# Patient Record
Sex: Male | Born: 1980
Health system: Southern US, Community
[De-identification: ages and names within clinical notes are randomized; demographics above are authoritative.]

## PROBLEM LIST (undated history)

## (undated) DIAGNOSIS — K648 Other hemorrhoids: Secondary | ICD-10-CM

## (undated) DIAGNOSIS — K760 Fatty (change of) liver, not elsewhere classified: Secondary | ICD-10-CM

## (undated) DIAGNOSIS — K529 Noninfective gastroenteritis and colitis, unspecified: Secondary | ICD-10-CM

## (undated) DIAGNOSIS — K219 Gastro-esophageal reflux disease without esophagitis: Secondary | ICD-10-CM

## (undated) HISTORY — PX: NO PAST SURGERIES: SHX2092

---

## 2014-09-01 ENCOUNTER — Ambulatory Visit (INDEPENDENT_AMBULATORY_CARE_PROVIDER_SITE_OTHER): Payer: BLUE CROSS/BLUE SHIELD | Admitting: Family Medicine

## 2014-09-01 ENCOUNTER — Encounter: Payer: Self-pay | Admitting: Family Medicine

## 2014-09-01 VITALS — BP 122/72 | HR 60 | Temp 97.8°F | Resp 16 | Ht 68.0 in | Wt 225.6 lb

## 2014-09-01 DIAGNOSIS — R197 Diarrhea, unspecified: Secondary | ICD-10-CM | POA: Diagnosis not present

## 2014-09-01 DIAGNOSIS — K625 Hemorrhage of anus and rectum: Secondary | ICD-10-CM | POA: Diagnosis not present

## 2014-09-01 MED ORDER — HYDROCORTISONE ACE-PRAMOXINE 2.5-1 % RE CREA: 1.0000 "application " | TOPICAL_CREAM | Freq: Three times a day (TID) | RECTAL | Status: DC

## 2014-09-01 NOTE — Patient Instructions (Signed)
We will call you with the lab results and decide on next step.

## 2014-09-01 NOTE — Progress Notes (Signed)
Subjective:     Patient ID: Kevin Roach, male   DOB: [birth year], 34 y.o.   MRN: [id number]  HPI  Chief Complaint  Patient presents with  . Abdominal Pain    Patient comes in office today with concerns of lower abdominal pain over the past 4 weeks. Patient states that pain has been radiating to his right side and describes it as a shooting pain. Patient states that he is having 5 bowel movements a day that is consistent of diarrhea and has noticed blood in the stool.   States his normal bowel pattern is 3-4 x day of formed stool. Now his stools are loose to pasty and up to 5 x day.Has seen bright red blood on his toilet paper. Reports a course of Zithromax prior to the onset of his sx. No weight loss or family hx of diverticulitis or IBD. Denies unusual stress in his life.   Review of Systems  Gastrointestinal: Positive for nausea (transient nausea a day or two ago. ). Negative for vomiting.       Objective:   Physical Exam  Constitutional: He appears well-developed and well-nourished. No distress.  Abdominal: Soft. There is tenderness (epigastric). There is no guarding.  Genitourinary: Guaiac positive stool (tender rectum without evidence of hemorrhoid).       Assessment:    1. Diarrhea - Stool C-Diff Toxin Assay  2. Rectal bleeding - hydrocortisone-pramoxine (ANALPRAM HC) 2.5-1 % rectal cream; Place 1 application rectally 3 (three) times daily.  Dispense: 30 g; Refill: 0    Plan:    Further evaluation pending results of study. Consider G.I.referral

## 2014-09-04 ENCOUNTER — Telehealth: Payer: Self-pay

## 2014-09-04 ENCOUNTER — Other Ambulatory Visit: Payer: Self-pay | Admitting: Family Medicine

## 2014-09-04 DIAGNOSIS — K625 Hemorrhage of anus and rectum: Secondary | ICD-10-CM

## 2014-09-04 DIAGNOSIS — R109 Unspecified abdominal pain: Secondary | ICD-10-CM

## 2014-09-04 LAB — CLOSTRIDIUM DIFFICILE EIA: C difficile Toxins A+B, EIA: NEGATIVE

## 2014-09-04 NOTE — Telephone Encounter (Signed)
Also try Prevacid 15 mg (over the counter) two pills daily. If not continuing to improve over the next few days-call for referral to G.I. Specialist.

## 2014-09-04 NOTE — Telephone Encounter (Signed)
Referral in progress. 

## 2014-09-04 NOTE — Telephone Encounter (Signed)
Advised patient of lab results. Patient reports that he still feels about the same. He reports that he will try Imodium. He also feels really bloated as well. Will the imodium help with this also? Please advise. Thanks!

## 2014-09-04 NOTE — Telephone Encounter (Signed)
Patient reports that he will try prevacid as well. Patient wants to go ahead and proceed with GI referral. He says that he just doesn't feel his symptoms are normal. Please call patient when referral is scheduled. Thanks!

## 2014-09-04 NOTE — Telephone Encounter (Signed)
-----   Message from Anola Gurney, [place] sent at 09/04/2014 10:13 AM EDT ----- Stool test is negative. Are you feeling better? May try imodium for loose stools if still present.

## 2014-09-04 NOTE — Telephone Encounter (Signed)
Advised pt as directed below, pt verbalized fully understanding.   Thanks,   

## 2014-09-05 ENCOUNTER — Telehealth: Payer: Self-pay

## 2014-09-05 ENCOUNTER — Other Ambulatory Visit: Payer: Self-pay | Admitting: Family Medicine

## 2014-09-05 DIAGNOSIS — R197 Diarrhea, unspecified: Secondary | ICD-10-CM

## 2014-09-05 MED ORDER — CIPROFLOXACIN HCL 500 MG PO TABS: 500.0000 mg | ORAL_TABLET | Freq: Two times a day (BID) | ORAL | Status: DC

## 2014-09-05 NOTE — Telephone Encounter (Signed)
-----   Message from Tobie Chauvin, PA sent at 09/04/2014 10:13 AM EDT ----- Stool test is negative. Are you feeling better? May try imodium for loose stools if still present. 

## 2014-09-05 NOTE — Telephone Encounter (Signed)
Have left a message regarding trial on Cipro for 3 days to cover for Traveler's diarrhea. G.I.consult is pending.

## 2014-09-05 NOTE — Telephone Encounter (Signed)
Patient has been advised he states that he still feels "cruddy" and bloated. He states there has been no improvement. KW

## 2014-09-18 ENCOUNTER — Other Ambulatory Visit: Payer: Self-pay | Admitting: Gastroenterology

## 2014-09-18 DIAGNOSIS — R748 Abnormal levels of other serum enzymes: Secondary | ICD-10-CM

## 2014-09-24 ENCOUNTER — Ambulatory Visit
Admission: RE | Admit: 2014-09-24 | Discharge: 2014-09-24 | Disposition: A | Discharge status: Home / Self Care | Payer: BLUE CROSS/BLUE SHIELD | Source: Ambulatory Visit | Attending: Gastroenterology | Admitting: Gastroenterology

## 2014-09-24 DIAGNOSIS — K760 Fatty (change of) liver, not elsewhere classified: Secondary | ICD-10-CM | POA: Insufficient documentation

## 2014-09-24 DIAGNOSIS — R748 Abnormal levels of other serum enzymes: Secondary | ICD-10-CM | POA: Diagnosis present

## 2014-09-24 DIAGNOSIS — K8020 Calculus of gallbladder without cholecystitis without obstruction: Secondary | ICD-10-CM | POA: Diagnosis not present

## 2014-10-02 ENCOUNTER — Encounter: Payer: Self-pay | Admitting: *Deleted

## 2014-10-03 ENCOUNTER — Ambulatory Visit
Admission: RE | Admit: 2014-10-03 | Discharge: 2014-10-03 | Disposition: A | Discharge status: Home / Self Care | Payer: BLUE CROSS/BLUE SHIELD | Source: Ambulatory Visit | Attending: Gastroenterology | Admitting: Gastroenterology

## 2014-10-03 ENCOUNTER — Encounter
Admission: RE | Disposition: A | Discharge status: Home / Self Care | Payer: Self-pay | Source: Ambulatory Visit | Attending: Gastroenterology

## 2014-10-03 ENCOUNTER — Ambulatory Visit: Payer: BLUE CROSS/BLUE SHIELD | Admitting: Anesthesiology

## 2014-10-03 ENCOUNTER — Encounter: Payer: Self-pay | Admitting: Anesthesiology

## 2014-10-03 DIAGNOSIS — K529 Noninfective gastroenteritis and colitis, unspecified: Secondary | ICD-10-CM | POA: Insufficient documentation

## 2014-10-03 DIAGNOSIS — K219 Gastro-esophageal reflux disease without esophagitis: Secondary | ICD-10-CM | POA: Diagnosis not present

## 2014-10-03 DIAGNOSIS — Z87891 Personal history of nicotine dependence: Secondary | ICD-10-CM | POA: Insufficient documentation

## 2014-10-03 DIAGNOSIS — Z79899 Other long term (current) drug therapy: Secondary | ICD-10-CM | POA: Insufficient documentation

## 2014-10-03 DIAGNOSIS — K640 First degree hemorrhoids: Secondary | ICD-10-CM | POA: Diagnosis not present

## 2014-10-03 HISTORY — DX: Gastro-esophageal reflux disease without esophagitis: K21.9

## 2014-10-03 HISTORY — PX: COLONOSCOPY WITH PROPOFOL: SHX5780

## 2014-10-03 HISTORY — PX: COLONOSCOPY: SHX174

## 2014-10-03 SURGERY — COLONOSCOPY WITH PROPOFOL
Anesthesia: General

## 2014-10-03 MED ORDER — LIDOCAINE HCL (CARDIAC) 20 MG/ML IV SOLN
INTRAVENOUS | Status: DC | PRN
  Administered 2014-10-03: 20 mg via INTRAVENOUS

## 2014-10-03 MED ORDER — SODIUM CHLORIDE 0.9 % IV SOLN: INTRAVENOUS | Status: DC

## 2014-10-03 MED ORDER — PROPOFOL 10 MG/ML IV BOLUS
INTRAVENOUS | Status: DC | PRN
  Administered 2014-10-03: 120 mg via INTRAVENOUS
  Administered 2014-10-03: 20 mg via INTRAVENOUS

## 2014-10-03 MED ORDER — GLYCOPYRROLATE 0.2 MG/ML IJ SOLN
INTRAMUSCULAR | Status: DC | PRN
  Administered 2014-10-03: 0.2 mg via INTRAVENOUS

## 2014-10-03 MED ORDER — PROPOFOL INFUSION 10 MG/ML OPTIME
INTRAVENOUS | Status: DC | PRN
  Administered 2014-10-03: 200 ug/kg/min via INTRAVENOUS

## 2014-10-03 MED ORDER — SODIUM CHLORIDE 0.9 % IV SOLN
INTRAVENOUS | Status: DC
  Administered 2014-10-03: 1000 mL via INTRAVENOUS
  Administered 2014-10-03: 14:00:00 via INTRAVENOUS

## 2014-10-03 NOTE — Anesthesia Postprocedure Evaluation (Signed)
  Anesthesia Post-op Note  Patient: Kevin Roach  Procedure(s) Performed: Procedure(s): COLONOSCOPY WITH PROPOFOL (N/A)  Anesthesia type:General  Patient location: PACU  Post pain: Pain level controlled  Post assessment: Post-op Vital signs reviewed, Patient's Cardiovascular Status Stable, Respiratory Function Stable, Patent Airway and No signs of Nausea or vomiting  Post vital signs: Reviewed and stable  Last Vitals:  Filed Vitals:   10/03/14 1440  BP: 97/66  Pulse: 44  Temp:   Resp: 20    Level of consciousness: awake, alert  and patient cooperative  Complications: No apparent anesthesia complications

## 2014-10-03 NOTE — Op Note (Signed)
[facility] Gastroenterology Patient Name: Kevin Roach Procedure Date: 10/03/2014 1:42 PM MRN: [id number] Account #: [id number] Date of Birth: 27-Oct-1980 Admit Type: Outpatient Age: 34 Room: [facility] ENDO ROOM 3 Gender: Male Note Status: Finalized Procedure:         Colonoscopy Indications:       Chronic diarrhea, abdominal bloating Patient Profile:   This is a 34 year old male. Providers:         Rhona Raider. Shelle Iron, MD Referring MD:      Demetrios Isaacs. Sherrie Mustache, MD (Referring MD) Medicines:         Propofol per Anesthesia Complications:     No immediate complications. Procedure:         Pre-Anesthesia Assessment:                    - Prior to the procedure, a History and Physical was                     performed, and patient medications, allergies and                     sensitivities were reviewed. The patient's tolerance of                     previous anesthesia was reviewed.                    After obtaining informed consent, the colonoscope was                     passed under direct vision. Throughout the procedure, the                     patient's blood pressure, pulse, and oxygen saturations                     were monitored continuously. The Colonoscope was                     introduced through the anus and advanced to the the                     terminal ileum. The colonoscopy was performed without                     difficulty. The patient tolerated the procedure well. The                     quality of the bowel preparation was good. Findings:      The perianal and digital rectal examinations were normal.      The terminal ileum appeared normal.      Internal hemorrhoids were found during retroflexion. The hemorrhoids       were Grade I (internal hemorrhoids that do not prolapse).      The exam was otherwise without abnormality.      Biopsies for histology were taken with a cold forceps from the right       colon, left colon and rectum for evaluation of  microscopic colitis. Impression:        - The examined portion of the ileum was normal.                    - Internal hemorrhoids.                    -  The examination was otherwise normal.                    - No specimens collected.                    - Likely IBS-D Recommendation:    - Observe patient in GI recovery unit.                    - Continue present medications.                    - Await pathology results.                    - Consider course of xifaxin for IBS-D.                    - Low fodmap diet, probiotic.                    - Return to GI clinic.                    - The findings and recommendations were discussed with the                     patient.                    - The findings and recommendations were discussed with the                     patient's family. Procedure Code(s): --- Professional ---                    [phone], Colonoscopy, flexible; with biopsy, single or                     multiple CPT copyright 2014 American Medical Association. All rights reserved. The codes documented in this report are preliminary and upon coder review may  be revised to meet current compliance requirements. Kathalene Frames, MD 10/03/2014 5:62:13 PM This report has been signed electronically. Number of Addenda: 0 Note Initiated On: 10/03/2014 1:42 PM Scope Withdrawal Time: 0 hours 13 minutes 22 seconds  Total Procedure Duration: 0 hours 17 minutes 34 seconds       [facility]

## 2014-10-03 NOTE — Anesthesia Procedure Notes (Signed)
Date/Time: 10/03/2014 1:40 PM Performed by: Lenard Simmer Pre-anesthesia Checklist: Patient identified, Emergency Drugs available, Suction available, Patient being monitored and Timeout performed Patient Re-evaluated:Patient Re-evaluated prior to inductionOxygen Delivery Method: Nasal cannula Preoxygenation: Pre-oxygenation with 100% oxygen Intubation Type: IV induction

## 2014-10-03 NOTE — Anesthesia Preprocedure Evaluation (Signed)
Anesthesia Evaluation  Patient identified by MRN, date of birth, ID band Patient awake    Reviewed: Allergy & Precautions, H&P , NPO status , Patient's Chart, lab work & pertinent test results, reviewed documented beta blocker date and time   History of Anesthesia Complications Negative for: history of anesthetic complications  Airway Mallampati: I  TM Distance: >3 FB Neck ROM: full    Dental no notable dental hx. (+) Teeth Intact   Pulmonary neg shortness of breath, neg sleep apnea, neg COPD, neg recent URI, former smoker,    Pulmonary exam normal breath sounds clear to auscultation       Cardiovascular Exercise Tolerance: Good negative cardio ROS Normal cardiovascular exam Rhythm:regular Rate:Normal     Neuro/Psych negative neurological ROS  negative psych ROS   GI/Hepatic Neg liver ROS, GERD  ,  Endo/Other  negative endocrine ROS  Renal/GU negative Renal ROS  negative genitourinary   Musculoskeletal   Abdominal   Peds  Hematology negative hematology ROS (+)   Anesthesia Other Findings Past Medical History:   GERD (gastroesophageal reflux disease)                       Reproductive/Obstetrics negative OB ROS                             Anesthesia Physical Anesthesia Plan  ASA: I  Anesthesia Plan: General   Post-op Pain Management:    Induction:   Airway Management Planned:   Additional Equipment:   Intra-op Plan:   Post-operative Plan:   Informed Consent: I have reviewed the patients History and Physical, chart, labs and discussed the procedure including the risks, benefits and alternatives for the proposed anesthesia with the patient or authorized representative who has indicated his/her understanding and acceptance.   Dental Advisory Given  Plan Discussed with: Anesthesiologist, CRNA and Surgeon  Anesthesia Plan Comments:         Anesthesia Quick  Evaluation

## 2014-10-03 NOTE — H&P (Signed)
  Primary Care Physician:  Anola Gurney, PA  Pre-Procedure History & Physical: HPI:  MILFRED KRAMMES is a 34 y.o. male is here for an colonoscopy.   Past Medical History  Diagnosis Date  . GERD (gastroesophageal reflux disease)     Past Surgical History  Procedure Laterality Date  . No past surgeries      Prior to Admission medications   Medication Sig Start Date End Date Taking? Authorizing Provider  ciprofloxacin (CIPRO) 500 MG tablet Take 1 tablet (500 mg total) by mouth 2 (two) times daily. Patient not taking: Reported on 10/03/2014 09/05/14   Anola Gurney, PA  hydrocortisone-pramoxine [facility]) 2.5-1 % rectal cream Place 1 application rectally 3 (three) times daily. Patient not taking: Reported on 10/03/2014 09/01/14   Anola Gurney, PA  omeprazole (PRILOSEC) 40 MG capsule Take 40 mg by mouth daily.    Historical Provider, MD    Allergies as of 09/15/2014  . (No Known Allergies)    Family History  Problem Relation Age of Onset  . Healthy Mother   . Alcohol abuse Father   . Healthy Brother   . Healthy Brother     Social History   Social History  . Marital Status: Divorced    Spouse Name: N/A  . Number of Children: N/A  . Years of Education: N/A   Occupational History  . Not on file.   Social History Main Topics  . Smoking status: Former Smoker    Types: Cigarettes    Quit date: 06/25/2014  . Smokeless tobacco: Former Neurosurgeon    Types: Snuff    Quit date: 06/25/2014  . Alcohol Use: 0.0 oz/week    0 Standard drinks or equivalent per week     Comment: occasional    . Drug Use: No  . Sexual Activity: Not on file   Other Topics Concern  . Not on file   Social History Narrative     Physical Exam: BP 117/77 mmHg  Pulse 78  Temp(Src) 97.3 F (36.3 C) (Tympanic)  Resp 16  Ht  (1.727 m)  Wt 103.42 kg (228 lb)  BMI 34.68 kg/m2  SpO2 98% General:   Alert,  pleasant and cooperative in NAD Head:  Normocephalic and atraumatic. Neck:  Supple; no  masses or thyromegaly. Lungs:  Clear throughout to auscultation.    Heart:  Regular rate and rhythm. Abdomen:  Soft, nontender and nondistended. Normal bowel sounds, without guarding, and without rebound.   Neurologic:  Alert and  oriented x4;  grossly normal neurologically.  Impression/Plan: TROY KANOUSE is here for an colonoscopy to be performed for chronic diarrhea  Risks, benefits, limitations, and alternatives regarding  colonoscopy have been reviewed with the patient.  Questions have been answered.  All parties agreeable.   Elnita Maxwell, MD  10/03/2014, 1:30 PM

## 2014-10-03 NOTE — Transfer of Care (Signed)
Immediate Anesthesia Transfer of Care Note  Patient: Kevin Roach  Procedure(s) Performed: Procedure(s): COLONOSCOPY WITH PROPOFOL (N/A)  Patient Location: Endoscopy Unit  Anesthesia Type:General  Level of Consciousness: awake  Airway & Oxygen Therapy: Patient Spontanous Breathing and Patient connected to nasal cannula oxygen  Post-op Assessment: Report given to RN and Post -op Vital signs reviewed and stable  Post vital signs: Reviewed and stable  Last Vitals:  Filed Vitals:   10/03/14 1227  BP: 117/77  Pulse: 78  Temp: 36.3 C  Resp: 16    Complications: No apparent anesthesia complications

## 2014-10-07 LAB — SURGICAL PATHOLOGY

## 2014-10-20 DIAGNOSIS — K529 Noninfective gastroenteritis and colitis, unspecified: Secondary | ICD-10-CM | POA: Insufficient documentation

## 2014-10-20 DIAGNOSIS — K589 Irritable bowel syndrome without diarrhea: Secondary | ICD-10-CM | POA: Insufficient documentation

## 2014-10-27 ENCOUNTER — Emergency Department (HOSPITAL_COMMUNITY)
Admission: EM | Admit: 2014-10-27 | Discharge: 2014-10-27 | Disposition: A | Discharge status: Home / Self Care | Payer: BLUE CROSS/BLUE SHIELD | Attending: Emergency Medicine | Admitting: Emergency Medicine

## 2014-10-27 ENCOUNTER — Encounter (HOSPITAL_COMMUNITY): Payer: Self-pay | Admitting: Family Medicine

## 2014-10-27 ENCOUNTER — Emergency Department (HOSPITAL_COMMUNITY): Payer: BLUE CROSS/BLUE SHIELD

## 2014-10-27 DIAGNOSIS — R1013 Epigastric pain: Secondary | ICD-10-CM | POA: Diagnosis present

## 2014-10-27 DIAGNOSIS — R197 Diarrhea, unspecified: Secondary | ICD-10-CM | POA: Insufficient documentation

## 2014-10-27 DIAGNOSIS — Z79899 Other long term (current) drug therapy: Secondary | ICD-10-CM | POA: Diagnosis not present

## 2014-10-27 DIAGNOSIS — Z720 Tobacco use: Secondary | ICD-10-CM | POA: Diagnosis not present

## 2014-10-27 DIAGNOSIS — K219 Gastro-esophageal reflux disease without esophagitis: Secondary | ICD-10-CM

## 2014-10-27 LAB — BASIC METABOLIC PANEL
Anion gap: 6 (ref 5–15)
BUN: 14 mg/dL (ref 6–20)
CHLORIDE: 102 mmol/L (ref 101–111)
CO2: 30 mmol/L (ref 22–32)
CREATININE: 1.19 mg/dL (ref 0.61–1.24)
Calcium: 9.8 mg/dL (ref 8.9–10.3)
GFR calc non Af Amer: >60 mL/min (ref >60)
Glucose, Bld: 95 mg/dL (ref 65–99)
POTASSIUM: 4.1 mmol/L (ref 3.5–5.1)
SODIUM: 138 mmol/L (ref 135–145)

## 2014-10-27 LAB — CBC
HEMATOCRIT: 45.1 % (ref 39.0–52.0)
HEMOGLOBIN: 15.1 g/dL (ref 13.0–17.0)
MCH: 30.3 pg (ref 26.0–34.0)
MCHC: 33.5 g/dL (ref 30.0–36.0)
MCV: 90.6 fL (ref 78.0–100.0)
Platelets: 202 10*3/uL (ref 150–400)
RBC: 4.98 MIL/uL (ref 4.22–5.81)
RDW: 13.1 % (ref 11.5–15.5)
WBC: 9.8 10*3/uL (ref 4.0–10.5)

## 2014-10-27 LAB — I-STAT CHEM 8, ED
BUN: 14 mg/dL (ref 6–20)
CALCIUM ION: 1.27 mmol/L — AB (ref 1.12–1.23)
CHLORIDE: 101 mmol/L (ref 101–111)
CREATININE: 1.1 mg/dL (ref 0.61–1.24)
GLUCOSE: 89 mg/dL (ref 65–99)
HCT: 47 % (ref 39.0–52.0)
Hemoglobin: 16 g/dL (ref 13.0–17.0)
POTASSIUM: 3.7 mmol/L (ref 3.5–5.1)
Sodium: 140 mmol/L (ref 135–145)
TCO2: 25 mmol/L (ref 0–100)

## 2014-10-27 LAB — I-STAT TROPONIN, ED
TROPONIN I, POC: 0 ng/mL (ref 0.00–0.08)
Troponin i, poc: 0 ng/mL (ref 0.00–0.08)

## 2014-10-27 MED ORDER — GI COCKTAIL ~~LOC~~
30.0000 mL | Freq: Once | ORAL | Status: AC
  Administered 2014-10-27: 30 mL via ORAL
  Filled 2014-10-27: qty 30

## 2014-10-27 MED ORDER — DICYCLOMINE HCL 10 MG/ML IM SOLN
20.0000 mg | Freq: Once | INTRAMUSCULAR | Status: DC
  Filled 2014-10-27: qty 2

## 2014-10-27 MED ORDER — OMEPRAZOLE 20 MG PO CPDR: 20.0000 mg | DELAYED_RELEASE_CAPSULE | Freq: Every day | ORAL | Status: DC

## 2014-10-27 NOTE — ED Notes (Signed)
Istat troponin running

## 2014-10-27 NOTE — ED Provider Notes (Signed)
CSN: [id number]   Arrival date & time 10/27/14 0209  History  By signing my name below, I, Bethel Born, attest that this documentation has been prepared under the direction and in the presence of Perkins Molina, MD. Electronically Signed: Bethel Born, ED Scribe. 10/27/2014. 2:49 AM.  Chief Complaint  Patient presents with  . Chest Pain    HPI Patient is a 34 y.o. male presenting with chest pain. The history is provided by the patient. No language interpreter was used.  Chest Pain Pain location:  Epigastric Pain quality: burning   Pain radiates to:  Upper back Pain radiates to the back: yes   Pain severity:  Moderate Onset quality:  Gradual Duration:  4 hours Timing:  Constant Progression:  Partially resolved Chronicity:  New Context: eating   Relieved by:  Antacids Worsened by:  Nothing tried Ineffective treatments:  None tried Associated symptoms: no cough, no fever, no nausea, no orthopnea, no palpitations, no PND, no shortness of breath, not vomiting and no weakness   Risk factors: no aortic disease, no Marfan's syndrome, no prior DVT/PE and no surgery    Kevin Roach is a 34 y.o. male with PMHx of GERD who presents to the Emergency Department complaining of new substernal chest pain with onset around 11 PM tonight. The pain starts at the epigastrium and radiates to the lower chest. Pt describes the pain as burning. He ate a beer and a cheeseburger for dinner. The pain improved after Tums just before midnight. Associated symptoms include belching. He has been having intermittent diarrhea for weeks and is seeing a GI specialist.  Pt denies a bitter taste in the back of the throat, constipation, nausea, and vomiting.   Past Medical History  Diagnosis Date  . GERD (gastroesophageal reflux disease)     Past Surgical History  Procedure Laterality Date  . No past surgeries    . Colonoscopy with propofol N/A 10/03/2014    Procedure: COLONOSCOPY WITH PROPOFOL;  Surgeon:  Elnita Maxwell, MD;  Location: [facility] ENDOSCOPY;  Service: Endoscopy;  Laterality: N/A;    Family History  Problem Relation Age of Onset  . Healthy Mother   . Alcohol abuse Father   . Healthy Brother   . Healthy Brother     Social History  Substance Use Topics  . Smoking status: Current Some Day Smoker    Types: Cigarettes  . Smokeless tobacco: Former Neurosurgeon    Types: Snuff    Quit date: 06/25/2014  . Alcohol Use: 0.0 oz/week    0 Standard drinks or equivalent per week     Comment: Usually drinks once a week. Last drink: Tonight     Review of Systems  Constitutional: Negative for fever and chills.  Respiratory: Negative for cough and shortness of breath.   Cardiovascular: Positive for chest pain. Negative for palpitations, orthopnea, leg swelling and PND.  Gastrointestinal: Positive for diarrhea. Negative for nausea, vomiting and constipation.       Belching  Neurological: Negative for weakness.  All other systems reviewed and are negative.  Home Medications   Prior to Admission medications   Medication Sig Start Date End Date Taking? Authorizing Provider  ciprofloxacin (CIPRO) 500 MG tablet Take 1 tablet (500 mg total) by mouth 2 (two) times daily. Patient not taking: Reported on 10/03/2014 09/05/14   Anola Gurney, PA  hydrocortisone-pramoxine [facility]) 2.5-1 % rectal cream Place 1 application rectally 3 (three) times daily. Patient not taking: Reported on 10/03/2014 09/01/14   Molly Maduro  Chauvin, PA  omeprazole (PRILOSEC) 40 MG capsule Take 40 mg by mouth daily.    Historical Provider, MD    Allergies  Review of patient's allergies indicates no known allergies.  Triage Vitals: BP 124/76 mmHg  Pulse 62  Temp(Src) 97.8 F (36.6 C) (Oral)  Resp 18  Ht  (1.753 m)  Wt 220 lb (99.791 kg)  BMI 32.47 kg/m2  SpO2 98%  Physical Exam  Constitutional: He is oriented to person, place, and time. He appears well-developed and well-nourished.  HENT:  Head: Normocephalic.   Mouth/Throat: Posterior oropharyngeal erythema present.  Eyes: Pupils are equal, round, and reactive to light.  Neck: Normal range of motion. Neck supple.  Trachea midline  Cardiovascular: Normal rate, regular rhythm and intact distal pulses.   Pulmonary/Chest: Effort normal and breath sounds normal. No stridor. No respiratory distress. He has no wheezes. He has no rales.  Abdominal: Soft. He exhibits no mass. There is no tenderness. There is no rebound and no guarding.  Exceptionally hyperactive bowel sounds  Musculoskeletal: Normal range of motion. He exhibits no edema or tenderness.  Lymphadenopathy:    He has no cervical adenopathy.  Neurological: He is alert and oriented to person, place, and time. He has normal reflexes.  Skin: Skin is warm and dry.  Psychiatric: He has a normal mood and affect. His behavior is normal.  Nursing note and vitals reviewed.   ED Course  Procedures   DIAGNOSTIC STUDIES: Oxygen Saturation is 98% on RA, normal by my interpretation.    COORDINATION OF CARE: 2:46 AM Discussed treatment plan which includes GI cocktail, Bentyl, CXR, EKG, and lab work with pt at bedside and pt agreed to plan.  Labs Reviewed  BASIC METABOLIC PANEL  CBC  I-STAT CHEM 8, ED    Imaging Review No results found.   I personally reviewed and evaluated these images and lab results as a part of my medical decision-making.   EKG Interpretation  Date/Time:  Monday October 27 2014 02:14:41 EDT Ventricular Rate:  60 PR Interval:  154 QRS Duration: 90 QT Interval:  402 QTC Calculation: 402 R Axis:   66 Text Interpretation:  Sinus rhythm Confirmed by [facility]  MD, Mishael Haran ([id number]) on 10/27/2014 2:38:29 AM    MDM   Final diagnoses:  None  HEART Score 0 PERC negative wells 0, highly doubt PE. Rules out for MI with 2 negative troponins and normal EKG.  Symptoms resolved with GI cocktail and are consistent with gerd.  Will start prilosec and gerd friendly diet and  have follow up with GI.  Strict return precautions given   I, Kataleyah Carducci-RASCH,Myranda Pavone K, personally performed the services described in this documentation. All medical record entries made by the scribe were at my direction and in my presence.  I have reviewed the chart and discharge instructions and agree that the record reflects my personal performance and is accurate and complete. Arda Daggs-RASCH,Harmonee Tozer K.  10/27/2014. 4:01 AM.   }   Cagney Degrace, MD 10/27/14 0401

## 2014-10-27 NOTE — ED Notes (Signed)
Pt given discharge instructions, verbalized understanding of discharge papers including need to follow up, reasons to return to the ED and medications to take at home. Pt denies pain or further questions at discharge. Pt able to ambulate to exit with significant other without difficulty.

## 2014-10-27 NOTE — ED Notes (Signed)
Patient reports at 23:00 last night he developed low stereum chest pain that radiates to his back. Pt denies the pain currently. Reports feeling lightheadness.

## 2014-11-13 ENCOUNTER — Other Ambulatory Visit: Payer: Self-pay | Admitting: Gastroenterology

## 2014-11-13 DIAGNOSIS — R1030 Lower abdominal pain, unspecified: Secondary | ICD-10-CM

## 2014-11-18 ENCOUNTER — Ambulatory Visit
Admission: RE | Admit: 2014-11-18 | Discharge: 2014-11-18 | Disposition: A | Discharge status: Home / Self Care | Payer: BLUE CROSS/BLUE SHIELD | Source: Ambulatory Visit | Attending: Gastroenterology | Admitting: Gastroenterology

## 2014-11-18 DIAGNOSIS — R1030 Lower abdominal pain, unspecified: Secondary | ICD-10-CM

## 2014-11-18 DIAGNOSIS — R161 Splenomegaly, not elsewhere classified: Secondary | ICD-10-CM | POA: Insufficient documentation

## 2014-11-18 DIAGNOSIS — K5730 Diverticulosis of large intestine without perforation or abscess without bleeding: Secondary | ICD-10-CM | POA: Diagnosis not present

## 2014-11-18 DIAGNOSIS — K760 Fatty (change of) liver, not elsewhere classified: Secondary | ICD-10-CM | POA: Insufficient documentation

## 2014-11-18 MED ORDER — IOHEXOL 300 MG/ML  SOLN: 100.0000 mL | Freq: Once | INTRAMUSCULAR | Status: DC | PRN

## 2014-11-18 MED ORDER — IOHEXOL 350 MG/ML SOLN
100.0000 mL | Freq: Once | INTRAVENOUS | Status: AC | PRN
  Administered 2014-11-18: 100 mL via INTRAVENOUS

## 2014-11-28 ENCOUNTER — Ambulatory Visit: Payer: BLUE CROSS/BLUE SHIELD | Admitting: Family Medicine

## 2014-11-28 DIAGNOSIS — K760 Fatty (change of) liver, not elsewhere classified: Secondary | ICD-10-CM | POA: Insufficient documentation

## 2014-11-30 ENCOUNTER — Emergency Department: Payer: BLUE CROSS/BLUE SHIELD

## 2014-11-30 ENCOUNTER — Observation Stay
Admission: EM | Admit: 2014-11-30 | Discharge: 2014-12-01 | Disposition: A | Discharge status: Home / Self Care | Payer: BLUE CROSS/BLUE SHIELD | Attending: Internal Medicine | Admitting: Internal Medicine

## 2014-11-30 ENCOUNTER — Encounter: Payer: Self-pay | Admitting: Emergency Medicine

## 2014-11-30 DIAGNOSIS — Z811 Family history of alcohol abuse and dependence: Secondary | ICD-10-CM | POA: Diagnosis not present

## 2014-11-30 DIAGNOSIS — Z9109 Other allergy status, other than to drugs and biological substances: Secondary | ICD-10-CM | POA: Diagnosis not present

## 2014-11-30 DIAGNOSIS — F17210 Nicotine dependence, cigarettes, uncomplicated: Secondary | ICD-10-CM | POA: Insufficient documentation

## 2014-11-30 DIAGNOSIS — R52 Pain, unspecified: Secondary | ICD-10-CM

## 2014-11-30 DIAGNOSIS — M79604 Pain in right leg: Secondary | ICD-10-CM | POA: Diagnosis not present

## 2014-11-30 DIAGNOSIS — K529 Noninfective gastroenteritis and colitis, unspecified: Secondary | ICD-10-CM | POA: Diagnosis not present

## 2014-11-30 DIAGNOSIS — M5127 Other intervertebral disc displacement, lumbosacral region: Secondary | ICD-10-CM | POA: Insufficient documentation

## 2014-11-30 DIAGNOSIS — M5440 Lumbago with sciatica, unspecified side: Secondary | ICD-10-CM

## 2014-11-30 DIAGNOSIS — K760 Fatty (change of) liver, not elsewhere classified: Secondary | ICD-10-CM | POA: Insufficient documentation

## 2014-11-30 DIAGNOSIS — K219 Gastro-esophageal reflux disease without esophagitis: Secondary | ICD-10-CM | POA: Diagnosis not present

## 2014-11-30 DIAGNOSIS — M5430 Sciatica, unspecified side: Secondary | ICD-10-CM | POA: Diagnosis not present

## 2014-11-30 DIAGNOSIS — G8929 Other chronic pain: Secondary | ICD-10-CM | POA: Diagnosis not present

## 2014-11-30 DIAGNOSIS — M545 Low back pain: Principal | ICD-10-CM | POA: Insufficient documentation

## 2014-11-30 DIAGNOSIS — M5459 Other low back pain: Secondary | ICD-10-CM | POA: Diagnosis present

## 2014-11-30 DIAGNOSIS — Z79899 Other long term (current) drug therapy: Secondary | ICD-10-CM | POA: Diagnosis not present

## 2014-11-30 LAB — CBC WITH DIFFERENTIAL/PLATELET
Basophils Absolute: 0 10*3/uL (ref 0–0.1)
Basophils Relative: 0 %
EOS ABS: 0.3 10*3/uL (ref 0–0.7)
Eosinophils Relative: 3 %
HCT: 51.6 % (ref 40.0–52.0)
HEMOGLOBIN: 17.2 g/dL (ref 13.0–18.0)
LYMPHS ABS: 2.3 10*3/uL (ref 1.0–3.6)
LYMPHS PCT: 27 %
MCH: 29.7 pg (ref 26.0–34.0)
MCHC: 33.4 g/dL (ref 32.0–36.0)
MCV: 89.1 fL (ref 80.0–100.0)
Monocytes Absolute: 0.5 10*3/uL (ref 0.2–1.0)
Monocytes Relative: 7 %
NEUTROS PCT: 63 %
Neutro Abs: 5.3 10*3/uL (ref 1.4–6.5)
Platelets: 185 10*3/uL (ref 150–440)
RBC: 5.79 MIL/uL (ref 4.40–5.90)
RDW: 13.1 % (ref 11.5–14.5)
WBC: 8.5 10*3/uL (ref 3.8–10.6)

## 2014-11-30 LAB — URINALYSIS COMPLETE WITH MICROSCOPIC (ARMC ONLY)
Bacteria, UA: NONE SEEN
Bilirubin Urine: NEGATIVE
Glucose, UA: NEGATIVE mg/dL
Hgb urine dipstick: NEGATIVE
Leukocytes, UA: NEGATIVE
NITRITE: NEGATIVE
PH: 5 (ref 5.0–8.0)
PROTEIN: NEGATIVE mg/dL
SPECIFIC GRAVITY, URINE: 1.028 (ref 1.005–1.030)
Squamous Epithelial / LPF: NONE SEEN

## 2014-11-30 LAB — COMPREHENSIVE METABOLIC PANEL
ALT: 86 U/L — ABNORMAL HIGH (ref 17–63)
ANION GAP: 4 — AB (ref 5–15)
AST: 51 U/L — ABNORMAL HIGH (ref 15–41)
Albumin: 4.1 g/dL (ref 3.5–5.0)
Alkaline Phosphatase: 45 U/L (ref 38–126)
BUN: 16 mg/dL (ref 6–20)
CHLORIDE: 107 mmol/L (ref 101–111)
CO2: 28 mmol/L (ref 22–32)
Calcium: 9.1 mg/dL (ref 8.9–10.3)
Creatinine, Ser: 1.2 mg/dL (ref 0.61–1.24)
Glucose, Bld: 79 mg/dL (ref 65–99)
POTASSIUM: 3.9 mmol/L (ref 3.5–5.1)
Sodium: 139 mmol/L (ref 135–145)
Total Bilirubin: 1.5 mg/dL — ABNORMAL HIGH (ref 0.3–1.2)
Total Protein: 6.9 g/dL (ref 6.5–8.1)

## 2014-11-30 MED ORDER — HYDROMORPHONE HCL 1 MG/ML IJ SOLN
INTRAMUSCULAR | Status: AC
  Filled 2014-11-30: qty 1

## 2014-11-30 MED ORDER — DOCUSATE SODIUM 100 MG PO CAPS
100.0000 mg | ORAL_CAPSULE | Freq: Two times a day (BID) | ORAL | Status: DC
  Administered 2014-11-30 – 2014-12-01 (×2): 100 mg via ORAL
  Filled 2014-11-30 (×2): qty 1

## 2014-11-30 MED ORDER — HEPARIN SODIUM (PORCINE) 5000 UNIT/ML IJ SOLN
5000.0000 [IU] | Freq: Three times a day (TID) | INTRAMUSCULAR | Status: DC
  Administered 2014-11-30 – 2014-12-01 (×2): 5000 [IU] via SUBCUTANEOUS
  Filled 2014-11-30 (×2): qty 1

## 2014-11-30 MED ORDER — POLYETHYLENE GLYCOL 3350 17 G PO PACK: 17.0000 g | PACK | Freq: Every day | ORAL | Status: DC | PRN

## 2014-11-30 MED ORDER — ONDANSETRON HCL 4 MG/2ML IJ SOLN: 4.0000 mg | Freq: Four times a day (QID) | INTRAMUSCULAR | Status: DC | PRN

## 2014-11-30 MED ORDER — ACETAMINOPHEN 650 MG RE SUPP: 650.0000 mg | Freq: Four times a day (QID) | RECTAL | Status: DC | PRN

## 2014-11-30 MED ORDER — OXYCODONE HCL 5 MG PO TABS: 5.0000 mg | ORAL_TABLET | ORAL | Status: DC | PRN

## 2014-11-30 MED ORDER — ONDANSETRON HCL 4 MG PO TABS: 4.0000 mg | ORAL_TABLET | Freq: Four times a day (QID) | ORAL | Status: DC | PRN

## 2014-11-30 MED ORDER — HYDROMORPHONE HCL 1 MG/ML IJ SOLN
1.0000 mg | INTRAMUSCULAR | Status: DC | PRN
  Administered 2014-12-01: 1 mg via INTRAVENOUS
  Filled 2014-11-30: qty 1

## 2014-11-30 MED ORDER — KETOROLAC TROMETHAMINE 30 MG/ML IJ SOLN
30.0000 mg | Freq: Once | INTRAMUSCULAR | Status: AC
  Administered 2014-11-30: 30 mg via INTRAVENOUS
  Filled 2014-11-30: qty 1

## 2014-11-30 MED ORDER — LORAZEPAM 2 MG/ML IJ SOLN
INTRAMUSCULAR | Status: AC
  Administered 2014-11-30: 1 mg via INTRAMUSCULAR
  Filled 2014-11-30: qty 1

## 2014-11-30 MED ORDER — LORAZEPAM 2 MG/ML IJ SOLN
1.0000 mg | Freq: Once | INTRAMUSCULAR | Status: AC
  Administered 2014-11-30: 1 mg via INTRAMUSCULAR

## 2014-11-30 MED ORDER — DEXAMETHASONE SODIUM PHOSPHATE 10 MG/ML IJ SOLN
INTRAMUSCULAR | Status: AC
  Filled 2014-11-30: qty 1

## 2014-11-30 MED ORDER — ACETAMINOPHEN 325 MG PO TABS: 650.0000 mg | ORAL_TABLET | Freq: Four times a day (QID) | ORAL | Status: DC | PRN

## 2014-11-30 MED ORDER — DEXAMETHASONE SODIUM PHOSPHATE 4 MG/ML IJ SOLN
4.0000 mg | Freq: Once | INTRAMUSCULAR | Status: AC
  Administered 2014-11-30: 4 mg via INTRAVENOUS

## 2014-11-30 MED ORDER — HYDROMORPHONE HCL 1 MG/ML IJ SOLN
1.0000 mg | Freq: Once | INTRAMUSCULAR | Status: AC
  Administered 2014-11-30: 1 mg via INTRAVENOUS

## 2014-11-30 NOTE — H&P (Signed)
[facility] [place] [facility]   PATIENT NAME: Kevin Roach    MR#:  [id number]  DATE OF BIRTH:  September 30, 1980   DATE OF ADMISSION:  11/30/2014  PRIMARY CARE PHYSICIAN: Anola Gurneyobert Chauvin, PA   REQUESTING/REFERRING PHYSICIAN: Darnelle CatalanMalinda  CHIEF COMPLAINT:   Chief Complaint  Patient presents with  . Back Pain    HISTORY OF PRESENT ILLNESS:  Kevin Roach  is a 34 y.o. male with a known history of GERD without esophagitis presenting with back pain. He describes approximately 4 day duration of back pain lower back with radiation down the right leg described as sharp/shooting intensity 8/10 worse with standing or movement summary relief while sitting still. He denies any trauma preceding symptoms. MRI actually performed in emergency department which reveals small L5-S1 disc protrusion without neural impingement. However, on attempts to ambulate patient became acutely symptomatic to the point where he is unable to stand/ambulate.  PAST MEDICAL HISTORY:   Past Medical History  Diagnosis Date  . GERD (gastroesophageal reflux disease)     PAST SURGICAL HISTORY:   Past Surgical History  Procedure Laterality Date  . No past surgeries    . Colonoscopy with propofol N/A 10/03/2014    Procedure: COLONOSCOPY WITH PROPOFOL;  Surgeon: Elnita MaxwellMatthew Gordon Rein, MD;  [facility];  Service: Endoscopy;  Laterality: N/A;    SOCIAL HISTORY:   Social History  Substance Use Topics  . Smoking status: Current Some Day Smoker    Types: Cigarettes  . Smokeless tobacco: Former NeurosurgeonUser    Types: Snuff    Quit date: 06/25/2014  . Alcohol Use: 0.0 oz/week    0 Standard drinks or equivalent per week     Comment: Usually drinks once a week. Last drink: Tonight    FAMILY HISTORY:   Family History  Problem Relation Age of Onset  . Healthy Mother   . Alcohol abuse Father   . Healthy Brother   . Healthy Brother     DRUG ALLERGIES:   Allergies  Allergen Reactions   . Ragwitek  [Short Ragweed Pollen Ext]     Other reaction(s): Cough    REVIEW OF SYSTEMS:  REVIEW OF SYSTEMS:  CONSTITUTIONAL: Denies fevers, chills, fatigue, weakness.  EYES: Denies blurred vision, double vision, or eye pain.  EARS, NOSE, THROAT: Denies tinnitus, ear pain, hearing loss.  RESPIRATORY: denies cough, shortness of breath, wheezing  CARDIOVASCULAR: Denies chest pain, palpitations, edema.  GASTROINTESTINAL: Denies nausea, vomiting, diarrhea, abdominal pain.  GENITOURINARY: Denies dysuria, hematuria.  ENDOCRINE: Denies nocturia or thyroid problems. HEMATOLOGIC AND LYMPHATIC: Denies easy bruising or bleeding.  SKIN: Denies rash or lesions.  MUSCULOSKELETAL: Denies pain in neck, , shoulder, knees, hips, or further arthritic symptoms. Positive back pain  NEUROLOGIC: Denies paralysis, paresthesias.  PSYCHIATRIC: Denies anxiety or depressive symptoms. Otherwise full review of systems performed by me is negative.   MEDICATIONS AT HOME:   Prior to Admission medications   Medication Sig Start Date End Date Taking? Authorizing Provider  cyclobenzaprine (FLEXERIL) 10 MG tablet Take 10 mg by mouth 3 (three) times daily as needed for muscle spasms.   Yes Historical Provider, MD  ibuprofen (ADVIL,MOTRIN) 200 MG tablet Take 800 mg by mouth every 8 (eight) hours as needed for mild pain or moderate pain.   Yes Historical Provider, MD  omeprazole (PRILOSEC) 20 MG capsule Take 1 capsule (20 mg total) by mouth daily. 10/27/14  Yes April Palumbo, MD      VITAL SIGNS:  Blood pressure  106/78, pulse 58, temperature 98.1 F (36.7 C), temperature source Oral, resp. rate 16, height  (1.753 m), weight 218 lb (98.884 kg), SpO2 95 %.  PHYSICAL EXAMINATION:  VITAL SIGNS: Filed Vitals:   11/30/14 2049  BP: 106/78  Pulse: 58  Temp:   Resp: 16   GENERAL:34 y.o.male currently in no acute distress.  HEAD: Normocephalic, atraumatic.  EYES: Pupils equal, round, reactive to light.  Extraocular muscles intact. No scleral icterus.  MOUTH: Moist mucosal membrane. Dentition intact. No abscess noted.  EAR, NOSE, THROAT: Clear without exudates. No external lesions.  NECK: Supple. No thyromegaly. No nodules. No JVD.  PULMONARY: Clear to ascultation, without wheeze rails or rhonci. No use of accessory muscles, Good respiratory effort. good air entry bilaterally CHEST: Nontender to palpation.  CARDIOVASCULAR: S1 and S2. Regular rate and rhythm. No murmurs, rubs, or gallops. No edema. Pedal pulses 2+ bilaterally.  GASTROINTESTINAL: Soft, nontender, nondistended. No masses. Positive bowel sounds. No hepatosplenomegaly.  MUSCULOSKELETAL: No swelling, clubbing, or edema.Positive straight leg test right leg  NEUROLOGIC: Cranial nerves II through XII are intact. No gross focal neurological deficits. Sensation intact. Reflexes intact.  SKIN: No ulceration, lesions, rashes, or cyanosis. Skin warm and dry. Turgor intact.  PSYCHIATRIC: Mood, affect within normal limits. The patient is awake, alert and oriented x 3. Insight, judgment intact.    LABORATORY PANEL:   CBC  Recent Labs Lab 11/30/14 1546  WBC 8.5  HGB 17.2  HCT 51.6  PLT 185   ------------------------------------------------------------------------------------------------------------------  Chemistries   Recent Labs Lab 11/30/14 1546  NA 139  K 3.9  CL 107  CO2 28  GLUCOSE 79  BUN 16  CREATININE 1.20  CALCIUM 9.1  AST 51*  ALT 86*  ALKPHOS 45  BILITOT 1.5*   ------------------------------------------------------------------------------------------------------------------  Cardiac Enzymes No results for input(s): TROPONINI in the last 168 hours. ------------------------------------------------------------------------------------------------------------------  RADIOLOGY:  Dg Eye Foreign Body  11/30/2014  CLINICAL DATA:  Metal working/exposure; clearance prior to MRI EXAM: ORBITS FOR FOREIGN BODY - 2  VIEW COMPARISON:  None. FINDINGS: There is no evidence of metallic foreign body within the orbits. No significant bone abnormality identified. IMPRESSION: No evidence of metallic foreign body within the orbits. Electronically Signed   By: Francene Boyers M.D.   On: 11/30/2014 17:07   Mr Lumbar Spine Wo Contrast  11/30/2014  CLINICAL DATA:  Low back pain and right leg pain. EXAM: MRI LUMBAR SPINE WITHOUT CONTRAST TECHNIQUE: Multiplanar, multisequence MR imaging of the lumbar spine was performed. No intravenous contrast was administered. COMPARISON:  CT scan of the abdomen dated 11/18/2014 FINDINGS: Normal conus tip at L1. Normal paraspinal soft tissues. No facet arthritis in the lumbar spine. T12-L1 and L1-2: Normal except for a tiny Schmorl's node in the superior endplate of L2. L2-3: Slight desiccation of the disc. No disc bulging or protrusion. 10 mm intraosseous lipoma adjacent to a tiny Schmorl's node in the L3 vertebral body. This is of no clinical significance. L3-4: Slight desiccation of the disc. Tiny Schmorl's node in the superior endplate of L4. No disc bulging or protrusion. L4-5: Minimal desiccation of the disc. Minimal left extra foraminal disc bulge with no neural impingement. L5-S1: Tiny central subligamentous disc protrusion slightly asymmetric to the left with no neural impingement. No foraminal stenosis. IMPRESSION: No findings to explain the patient's of right leg pain. Small Schmorl's nodes in L2, L3, and L4. Tiny disc protrusion at L5-S1 without neural impingement. Electronically Signed   By: Francene Boyers M.D.  On: 11/30/2014 17:53    EKG:   Orders placed or performed during the hospital encounter of 10/27/14  . EKG 12-Lead  . EKG 12-Lead  . ED EKG within 10 minutes  . ED EKG within 10 minutes  . EKG    IMPRESSION AND PLAN:   34 year old Caucasian gentleman history of GERD without esophagitis presenting with back pain  1. Intractable back pain, sciatica right leg: Provide  symptomatic relief, get physical therapy involved. 2. GERD without esophagitis PPI therapy 3. Venous thromboembolism prophylactic: Heparin subcutaneous   All the records are reviewed and case discussed with ED provider. Management plans discussed with the patient, family and they are in agreement.  CODE STATUS: Full  TOTAL TIME TAKING CARE OF THIS PATIENT: 35  minutes.    Genavieve Mangiapane,  Mardi Mainland.D on 11/30/2014 at 11:01 PM  Between 7am to 6pm - Pager - [phone]  After 6pm: House Pager: - [phone]  [facility]  Office  [phone]  CC: Primary care physician; Anola Gurney, PA

## 2014-11-30 NOTE — ED Provider Notes (Signed)
[facility] Emergency Department Provider Note  ____________________________________________  Time seen: Approximately 4:07 PM  I have reviewed the triage vital signs and the nursing notes.   HISTORY  Chief Complaint Back Pain   HPI Kevin Roach is a 34 y.o. male patient reports he had some back pain Thursday went to bed usually the pain gets better after he sleeps but he when he woke up on Friday was very severe get worse when he Got up out of bed pain is very low and his back over the sacrum radiates down the back of his right leg worse when he straightens his leg out patient has no weakness or incontinence. Patient reports he's had this before and he cannot see a specialist for an MRI he was told he has had a CT of the abdomen and pelvis which only showed what appeared to be a fatty liver. He is currently severe achy in nature not seem to have been brought on by anything particularly is made worse now by any movements  Past Medical History  Diagnosis Date  . GERD (gastroesophageal reflux disease)     Patient Active Problem List   Diagnosis Date Noted  . Fatty infiltration of liver 11/28/2014  . Chronic diarrhea 10/20/2014    Past Surgical History  Procedure Laterality Date  . No past surgeries    . Colonoscopy with propofol N/A 10/03/2014    Procedure: COLONOSCOPY WITH PROPOFOL;  Surgeon: Elnita MaxwellMatthew Gordon Rein, MD;  [facility];  Service: Endoscopy;  Laterality: N/A;    Current Outpatient Rx  Name  Route  Sig  Dispense  Refill  . cyclobenzaprine (FLEXERIL) 10 MG tablet   Oral   Take 10 mg by mouth 3 (three) times daily as needed for muscle spasms.         Marland Kitchen. ibuprofen (ADVIL,MOTRIN) 200 MG tablet   Oral   Take 800 mg by mouth every 8 (eight) hours as needed for mild pain or moderate pain.         Marland Kitchen. omeprazole (PRILOSEC) 20 MG capsule   Oral   Take 1 capsule (20 mg total) by mouth daily.   30 capsule   0      Allergies Ragwitek   Family History  Problem Relation Age of Onset  . Healthy Mother   . Alcohol abuse Father   . Healthy Brother   . Healthy Brother     Social History Social History  Substance Use Topics  . Smoking status: Current Some Day Smoker    Types: Cigarettes  . Smokeless tobacco: Former NeurosurgeonUser    Types: Snuff    Quit date: 06/25/2014  . Alcohol Use: 0.0 oz/week    0 Standard drinks or equivalent per week     Comment: Usually drinks once a week. Last drink: Tonight    Review of Systems Constitutional: No fever/chills Eyes: No visual changes. ENT: No sore throat. Cardiovascular: Denies chest pain. Respiratory: Denies shortness of breath. Gastrointestinal: No abdominal pain.  No nausea, no vomiting.  No diarrhea.  No constipation. Genitourinary: Negative for dysuria. Musculoskeletal: See history of present illness Skin: Negative for rash. Neurological: Negative for headaches, focal weakness or numbness.  10-point ROS otherwise negative.  ____________________________________________   PHYSICAL EXAM:  VITAL SIGNS: ED Triage Vitals  Enc Vitals Group     BP 11/30/14 1404 115/80 mmHg     Pulse Rate 11/30/14 1404 58     Resp 11/30/14 1404 14     Temp  11/30/14 1404 98.1 F (36.7 C)     Temp Source 11/30/14 1404 Oral     SpO2 11/30/14 1404 95 %     Weight 11/30/14 1404 218 lb (98.884 kg)     Height 11/30/14 1404  (1.753 m)     Head Cir --      Peak Flow --      Pain Score 11/30/14 1405 10     Pain Loc --      Pain Edu? --      Excl. in GC? --     Constitutional: Alert and oriented. Lying in bed on his right side curled up fetal position Eyes: Conjunctivae are normal. PERRL. EOMI. Head: Atraumatic. Nose: No congestion/rhinnorhea. Mouth/Throat: Mucous membranes are moist.  Oropharynx non-erythematous. Neck: No stridor. No tenderness of the C-spine palpation  Cardiovascular: Normal rate, regular rhythm. Grossly normal heart sounds.  Good  peripheral circulation. Respiratory: Normal respiratory effort.  No retractions. Lungs CTAB. Gastrointestinal: Soft and nontender. No distention. No abdominal bruits. No CVA tenderness. Musculoskeletal: No lower extremity tenderness nor edema.  No joint effusions. Neurologic:  Normal speech and language. No gross focal neurologic deficits are appreciated. DTRs in both knees and ankles are normal motor strength in both upper and lower legs and great toes is normal although dorsiflexion of the hip is painful  Skin:  Skin is warm, dry and intact. No rash noted. Psychiatric: Mood and affect are normal. Speech and behavior are normal. Rectal exam: Good tone and normal prostate no masses no blood ____________________________________________   LABS (all labs ordered are listed, but only abnormal results are displayed)  Labs Reviewed  URINALYSIS COMPLETEWITH MICROSCOPIC ([facility] ONLY) - Abnormal; Notable for the following:    Color, Urine YELLOW (*)    APPearance CLEAR (*)    Ketones, ur 2+ (*)    All other components within normal limits  COMPREHENSIVE METABOLIC PANEL - Abnormal; Notable for the following:    AST 51 (*)    ALT 86 (*)    Total Bilirubin 1.5 (*)    Anion gap 4 (*)    All other components within normal limits  CBC WITH DIFFERENTIAL/PLATELET   ____________________________________________  EKG   ____________________________________________  RADIOLOGY  MRI shows nothing that could be causing the patient's pain.  ____________________________________________   PROCEDURES   ____________________________________________   INITIAL IMPRESSION / ASSESSMENT AND PLAN / ED COURSE  Pertinent labs & imaging results that were available during my care of the patient were reviewed by me and considered in my medical decision making (see chart for details).  I'm and IM patient cannot get up out of bed and stand on discharge I will in and up admitting  him ____________________________________________   FINAL CLINICAL IMPRESSION(S) / ED DIAGNOSES  Final diagnoses:  Pain  Midline low back pain with sciatica, sciatica laterality unspecified      Arnaldo Natal, MD 11/30/14 2215

## 2014-11-30 NOTE — ED Notes (Signed)
Attempted to get patient up and out of bed. Initially went from laying to sitting and then to standing.  Patient did not tolerate standing.  Attempted to stand, but unable to put weight of legs stating "the downward pressure makes the pain to my low back and hips intolerable".  Dr. Darnelle CatalanMalinda alerted, [facility].  Continue to monitor.

## 2014-11-30 NOTE — ED Notes (Signed)
[facility] c/o Lower back pain since Thursday. Patient reports that pain radiates into his right leg. Patient denies numbness or tingling in right leg.

## 2014-11-30 NOTE — ED Notes (Signed)
Patient able to stand at bedside, but unable to ambulate with assist.  Stating pain increases with the "shift of weight" that happens with ambulation.  Patient reports that muscles "tightness" is improved after the ativan.  Continue to monitor.

## 2014-12-01 MED ORDER — PANTOPRAZOLE SODIUM 40 MG PO TBEC
40.0000 mg | DELAYED_RELEASE_TABLET | Freq: Every day | ORAL | Status: DC
  Administered 2014-12-01: 40 mg via ORAL
  Filled 2014-12-01: qty 1

## 2014-12-01 MED ORDER — OXYCODONE HCL 5 MG PO TABS: 5.0000 mg | ORAL_TABLET | ORAL | Status: DC | PRN

## 2014-12-01 MED ORDER — CYCLOBENZAPRINE HCL 10 MG PO TABS
10.0000 mg | ORAL_TABLET | Freq: Three times a day (TID) | ORAL | Status: DC | PRN
  Administered 2014-12-01: 10 mg via ORAL
  Filled 2014-12-01: qty 1

## 2014-12-01 MED ORDER — METHYLPREDNISOLONE 4 MG PO TABS: 4.0000 mg | ORAL_TABLET | Freq: Every day | ORAL | Status: DC

## 2014-12-01 MED ORDER — PREDNISONE 50 MG PO TABS
50.0000 mg | ORAL_TABLET | Freq: Once | ORAL | Status: AC
  Administered 2014-12-01: 50 mg via ORAL
  Filled 2014-12-01: qty 1

## 2014-12-01 MED ORDER — OMEPRAZOLE 20 MG PO CPDR: 20.0000 mg | DELAYED_RELEASE_CAPSULE | Freq: Every day | ORAL | Status: DC

## 2014-12-01 NOTE — Care Management (Signed)
PT is recommending OPPT for this patient; MD aware. Patient will take Rx to the OPPT of his choice for therapy. Crutches requested from [facility] to be delivered to this room prior to discharge to home today. No further RNCM needs.

## 2014-12-01 NOTE — Discharge Summary (Signed)
[facility] [place] [facility]   PATIENT NAME: Kevin Roach    MR#:  [id number]  DATE OF BIRTH:  [birth year]  DATE OF ADMISSION:  11/30/2014 ADMITTING PHYSICIAN: Wyatt Haste, MD  DATE OF DISCHARGE: 12/01/2014  PRIMARY CARE PHYSICIAN: Anola Gurney, PA    ADMISSION DIAGNOSIS:  Pain [R52] Midline low back pain with sciatica, sciatica laterality unspecified [M54.40]  DISCHARGE DIAGNOSIS:  Active Problems:   Intractable low back pain   SECONDARY DIAGNOSIS:   Past Medical History  Diagnosis Date  . GERD (gastroesophageal reflux disease)     HOSPITAL COURSE:   34 year old male with past medical history significant for gastroesophageal and chronic low back pain presents to the hospital secondary to acute worsening of his back pain associated with right leg radicular pain.  #1 acute low back pain- musculoskeletal pain. -MRI of the lumbar spine showing L5-S1 disc protrusion with no nerve impingement. -Continue pain medications, muscle relaxants. -Started on Medrol Dosepak as well. - appreciate physical therapy consult, patient is being discharged with crutches. -Outpatient orthopedics follow-up recommended.  - Discontinue ibuprofen, due to his significant reflux symptoms and also he was taking a higher strength for a longer time  #2 gastroesophageal reflux disease-continue omeprazole  Patient will be discharged home today. Outpatient PT recommended  DISCHARGE CONDITIONS:   Stable  CONSULTS OBTAINED:  Treatment Team:  Wyatt Haste, MD  DRUG ALLERGIES:   Allergies  Allergen Reactions  . Ragwitek  [Short Ragweed Pollen Ext]     Other reaction(s): Cough    DISCHARGE MEDICATIONS:   Current Discharge Medication List    START taking these medications   Details  methylPREDNISolone (MEDROL) 4 MG tablet Take 1 tablet (4 mg total) by mouth daily. Take 6 tabs on day 1, 5 tabs on day 2, 4 tabs on day 3, 3 tabs on day 4, 2 tabs on day 5, 1  tab on day 6 in divided doses as instructed on the packet Qty: 21 tablet, Refills: 0    oxyCODONE (OXY IR/ROXICODONE) 5 MG immediate release tablet Take 1-2 tablets (5-10 mg total) by mouth every 4 (four) hours as needed for moderate pain. Qty: 20 tablet, Refills: 0      CONTINUE these medications which have CHANGED   Details  omeprazole (PRILOSEC) 20 MG capsule Take 1 capsule (20 mg total) by mouth daily. Qty: 30 capsule, Refills: 2      CONTINUE these medications which have NOT CHANGED   Details  cyclobenzaprine (FLEXERIL) 10 MG tablet Take 10 mg by mouth 3 (three) times daily as needed for muscle spasms.      STOP taking these medications     ibuprofen (ADVIL,MOTRIN) 200 MG tablet          DISCHARGE INSTRUCTIONS:   1. Follow-up with orthopedics in 1-2 weeks for low back pain  If you experience worsening of your admission symptoms, develop shortness of breath, life threatening emergency, suicidal or homicidal thoughts you must seek medical attention immediately by calling 911 or calling your MD immediately  if symptoms less severe.  You Must read complete instructions/literature along with all the possible adverse reactions/side effects for all the Medicines you take and that have been prescribed to you. Take any new Medicines after you have completely understood and accept all the possible adverse reactions/side effects.   Please note  You were cared for by a hospitalist during your hospital stay. If you have any questions about your discharge medications or the  care you received while you were in the hospital after you are discharged, you can call the unit and asked to speak with the hospitalist on call if the hospitalist that took care of you is not available. Once you are discharged, your primary care physician will handle any further medical issues. Please note that NO REFILLS for any discharge medications will be authorized once you are discharged, as it is imperative that  you return to your primary care physician (or establish a relationship with a primary care physician if you do not have one) for your aftercare needs so that they can reassess your need for medications and monitor your lab values.    Today   CHIEF COMPLAINT:   Chief Complaint  Patient presents with  . Back Pain    VITAL SIGNS:  Blood pressure 111/61, pulse 81, temperature 97.9 F (36.6 C), temperature source Oral, resp. rate 18, height  (1.753 m), weight 98.884 kg (218 lb), SpO2 89 %.  I/O:   Intake/Output Summary (Last 24 hours) at 12/01/14 1104 Last data filed at 12/01/14 0952  Gross per 24 hour  Intake    720 ml  Output    350 ml  Net    370 ml    PHYSICAL EXAMINATION:   Physical Exam  GENERAL:  34 y.o.-year-old patient lying in the bed with no acute distress.  EYES: Pupils equal, round, reactive to light and accommodation. No scleral icterus. Extraocular muscles intact.  HEENT: Head atraumatic, normocephalic. Oropharynx and nasopharynx clear.  NECK:  Supple, no jugular venous distention. No thyroid enlargement, no tenderness.  LUNGS: Normal breath sounds bilaterally, no wheezing, rales,rhonchi or crepitation. No use of accessory muscles of respiration.  CARDIOVASCULAR: S1, S2 normal. No murmurs, rubs, or gallops.  ABDOMEN: Soft, non-tender, non-distended. Bowel sounds present. No organomegaly or mass.  EXTREMITIES: No pedal edema, cyanosis, or clubbing.  NEUROLOGIC: Cranial nerves II through XII are intact. Muscle strength 5/5 in all extremities. Sensation intact. Ambulated with physical therapy. Regarding of the lower back noted. Ambulated okay with crutches.  PSYCHIATRIC: The patient is alert and oriented x 3.  SKIN: No obvious rash, lesion, or ulcer.   DATA REVIEW:   CBC  Recent Labs Lab 11/30/14 1546  WBC 8.5  HGB 17.2  HCT 51.6  PLT 185    Chemistries   Recent Labs Lab 11/30/14 1546  NA 139  K 3.9  CL 107  CO2 28  GLUCOSE 79  BUN 16   CREATININE 1.20  CALCIUM 9.1  AST 51*  ALT 86*  ALKPHOS 45  BILITOT 1.5*    Cardiac Enzymes No results for input(s): TROPONINI in the last 168 hours.  Microbiology Results  Results for orders placed or performed in visit on 09/01/14  Stool C-Diff Toxin Assay     Status: None   Collection Time: 09/01/14 11:31 AM  Result Value Ref Range Status   C difficile Toxins A+B, EIA Negative Negative Final    RADIOLOGY:  Dg Eye Foreign Body  11/30/2014  CLINICAL DATA:  Metal working/exposure; clearance prior to MRI EXAM: ORBITS FOR FOREIGN BODY - 2 VIEW COMPARISON:  None. FINDINGS: There is no evidence of metallic foreign body within the orbits. No significant bone abnormality identified. IMPRESSION: No evidence of metallic foreign body within the orbits. Electronically Signed   By: Francene Boyers M.D.   On: 11/30/2014 17:07   Mr Lumbar Spine Wo Contrast  11/30/2014  CLINICAL DATA:  Low back pain and right leg pain.  EXAM: MRI LUMBAR SPINE WITHOUT CONTRAST TECHNIQUE: Multiplanar, multisequence MR imaging of the lumbar spine was performed. No intravenous contrast was administered. COMPARISON:  CT scan of the abdomen dated 11/18/2014 FINDINGS: Normal conus tip at L1. Normal paraspinal soft tissues. No facet arthritis in the lumbar spine. T12-L1 and L1-2: Normal except for a tiny Schmorl's node in the superior endplate of L2. L2-3: Slight desiccation of the disc. No disc bulging or protrusion. 10 mm intraosseous lipoma adjacent to a tiny Schmorl's node in the L3 vertebral body. This is of no clinical significance. L3-4: Slight desiccation of the disc. Tiny Schmorl's node in the superior endplate of L4. No disc bulging or protrusion. L4-5: Minimal desiccation of the disc. Minimal left extra foraminal disc bulge with no neural impingement. L5-S1: Tiny central subligamentous disc protrusion slightly asymmetric to the left with no neural impingement. No foraminal stenosis. IMPRESSION: No findings to explain  the patient's of right leg pain. Small Schmorl's nodes in L2, L3, and L4. Tiny disc protrusion at L5-S1 without neural impingement. Electronically Signed   By: Francene BoyersJames  Maxwell M.D.   On: 11/30/2014 17:53    EKG:   Orders placed or performed during the hospital encounter of 10/27/14  . EKG 12-Lead  . EKG 12-Lead  . ED EKG within 10 minutes  . ED EKG within 10 minutes  . EKG      Management plans discussed with the patient, family and they are in agreement.  CODE STATUS:     Code Status Orders        Start     Ordered   11/30/14 2242  Full code   Continuous     11/30/14 2242      TOTAL TIME TAKING CARE OF THIS PATIENT: 37 minutes.    Enid BaasKALISETTI,Leray Garverick M.D on 12/01/2014 at 11:04 AM  Between 7am to 6pm - Pager - [phone]  After 6pm go to [email or web address] - password [email or web address] [facility]  [facility]  Office  [phone]  CC: Primary care physician; Anola Gurneyobert Chauvin, PA

## 2014-12-01 NOTE — Progress Notes (Signed)
     Kevin ScotRobert Roach was admitted to the [facility] on 11/30/2014 for an acute medical condition and is being Discharged on  12/01/2014 . He will still be under treatment for the next 6 days. He will need at least 3-4 days for recovery and so advised to stay away from work until then. After that he is advised to transition to light to moderate activity which can involve sitting at the desk and also lifting minimal weights. From next week he should be able to participate in regular/heavy intensity work. So please excuse him from work for 4 Days. Should be able to return to work from 12/06/2014.  Call Enid Baasadhika Tru Rana  MD, [facility] at  [phone] with questions.  Enid BaasKALISETTI,[name] M.D on 12/01/2014,at 10:49 AM  [facility] [address], [place] [address]

## 2014-12-01 NOTE — Progress Notes (Signed)
Discharge instructions reviewed with the wife and patient.  rx given to the pt.  No rx for flexeril so MD will write RX for this. Pt being sent out via wheelchair with crutches and belongings

## 2014-12-01 NOTE — Evaluation (Signed)
Physical Therapy Evaluation Patient Details Name: Kevin Roach MRN: [id number] DOB: July 31, 1980 Today's Date: 12/01/2014   History of Present Illness  Pt is a 34 yo male who was admitted to the hospital for 4 day hx of LBP with radiating pain down the R leg. Pt's mobility severely affected secondary to increased pain in standing.   Clinical Impression  Pt presents with hx of GERD. Examination reveals that pt performs bed mobility independently, transfers at mod I, and ambulates household distances best with crutches. He is very stable with all mobility and shows fair activity tolerance standing with crutches. Following piriformis stretching and sciatic nerve gliding the pt's symptom's improved to the point where ambulation was possible. Pt states he has never had therapy for this before. He will continue to benefit from skilled PT in order to return to baseline level of function and manage his condition to prevent readmission for it again. This entire session was guided, instructed, and directly supervised by Elizabeth Palau, DPT.     Follow Up Recommendations Outpatient PT    Equipment Recommendations  Crutches    Recommendations for Other Services       Precautions / Restrictions Precautions Precautions: None Restrictions Weight Bearing Restrictions: No      Mobility  Bed Mobility Overal bed mobility: Independent             General bed mobility comments: needs no assist.   Transfers Overall transfer level: Modified independent Equipment used: Rolling walker (2 wheeled)             General transfer comment: Pt uses RW to get into standing. Task is performed slow and guarded, but pt with no unsteadiness.  Ambulation/Gait Ambulation/Gait assistance: Min guard Ambulation Distance (Feet): 80 Feet (x 4 reps) Assistive device: Rolling walker (2 wheeled);Crutches Gait Pattern/deviations: Trunk flexed;Decreased step length - right;Decreased step length -  left;Step-through pattern Gait velocity: decreased Gait velocity interpretation: Below normal speed for age/gender General Gait Details: Pt performs all ambulation with good stability and CGA for safety. He ambulated with both crutches and RW (refrence other exercises)  Stairs            Wheelchair Mobility    Modified Rankin (Stroke Patients Only)       Balance Overall balance assessment: Modified Independent                                           Pertinent Vitals/Pain Pain Assessment: 0-10 Pain Score: 2  Pain Location: R low back Pain Descriptors / Indicators: Shooting Pain Intervention(s): Limited activity within patient's tolerance;Monitored during session;Premedicated before session    Home Living Family/patient expects to be discharged to:: Private residence Living Arrangements: Alone (May go to live with SO) Available Help at Discharge: Family;Available PRN/intermittently Type of Home: House Home Access: Stairs to enter Entrance Stairs-Rails: None Entrance Stairs-Number of Steps: 2 Home Layout: One level Home Equipment: Walker - 2 wheels Additional Comments: significant other/friend offered for pt to live with her in time being for use of hand rails during stair navigation into house    Prior Function Level of Independence: Independent         Comments: Pt was indep with ambulation, ADLs, driving, and had a job performing labor tasks      Hand Dominance        Extremity/Trunk Assessment   Upper Extremity Assessment: Overall [facility]  for tasks assessed           Lower Extremity Assessment: Overall WFL for tasks assessed         Communication   Communication: No difficulties  Cognition Arousal/Alertness: Awake/alert Behavior During Therapy: WFL for tasks assessed/performed Overall Cognitive Status: Within Functional Limits for tasks assessed                      General Comments      Exercises Other  Exercises Other Exercises: PT provided piriformis stretch 2 x 30 seconds at min A. Pt was educated on how to perform this stretch independently. PT also had pt perform sciatic nerve glide x 10 reps and also provided education to the pt about performing this independently  Other Exercises: PT performed gait training with RW, which it was found that he prefers ambulating with crutches. Adjustments were made for a proper fit. Pt is able to successfully off-load himself while ambulating with crutches. Pt needs cues on how to properly use the crutches as well as appropriate step length      Assessment/Plan    PT Assessment Patient needs continued PT services  PT Diagnosis Acute pain;Abnormality of gait   PT Problem List Decreased activity tolerance;Decreased mobility  PT Treatment Interventions DME instruction;Gait training;Stair training;Therapeutic activities;Functional mobility training;Therapeutic exercise;Balance training;Neuromuscular re-education   PT Goals (Current goals can be found in the Care Plan section) Acute Rehab PT Goals Patient Stated Goal: to return home PT Goal Formulation: With patient Time For Goal Achievement: 12/15/14 Potential to Achieve Goals: Good    Frequency Min 2X/week   Barriers to discharge        Co-evaluation               End of Session Equipment Utilized During Treatment: Gait belt Activity Tolerance: Patient tolerated treatment well Patient left: in chair;with call bell/phone within reach;with family/visitor present           Time: [id number] PT Time Calculation (min) (ACUTE ONLY): 40 min   Charges:         PT G CodesBenna Dunks:        Treyce Spillers 12/01/2014, 12:09 PM  Benna Dunksasey Lenni Reckner, SPT. [phone]

## 2015-06-25 ENCOUNTER — Ambulatory Visit
Admission: RE | Admit: 2015-06-25 | Discharge: 2015-06-25 | Disposition: A | Discharge status: Home / Self Care | Payer: BLUE CROSS/BLUE SHIELD | Source: Ambulatory Visit | Attending: Family Medicine | Admitting: Family Medicine

## 2015-06-25 ENCOUNTER — Encounter: Payer: Self-pay | Admitting: Family Medicine

## 2015-06-25 ENCOUNTER — Ambulatory Visit (INDEPENDENT_AMBULATORY_CARE_PROVIDER_SITE_OTHER): Payer: BLUE CROSS/BLUE SHIELD | Admitting: Family Medicine

## 2015-06-25 VITALS — BP 118/70 | HR 64 | Temp 98.1°F | Resp 16 | Wt 230.0 lb

## 2015-06-25 DIAGNOSIS — M79641 Pain in right hand: Secondary | ICD-10-CM | POA: Diagnosis not present

## 2015-06-25 DIAGNOSIS — M25531 Pain in right wrist: Secondary | ICD-10-CM

## 2015-06-25 MED ORDER — TRAMADOL HCL 50 MG PO TABS: 50.0000 mg | ORAL_TABLET | Freq: Four times a day (QID) | ORAL | Status: DC | PRN

## 2015-06-25 NOTE — Patient Instructions (Signed)
Increase Aleve to two pills twice daily with food. We will call you with the x-ray results.
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
## 2015-08-20 ENCOUNTER — Ambulatory Visit (INDEPENDENT_AMBULATORY_CARE_PROVIDER_SITE_OTHER): Payer: BLUE CROSS/BLUE SHIELD | Admitting: Family Medicine

## 2015-08-20 ENCOUNTER — Encounter: Payer: Self-pay | Admitting: Family Medicine

## 2015-08-20 VITALS — BP 110/78 | HR 70 | Temp 98.4°F | Resp 16 | Wt 226.2 lb

## 2015-08-20 DIAGNOSIS — S39012A Strain of muscle, fascia and tendon of lower back, initial encounter: Secondary | ICD-10-CM

## 2015-08-20 MED ORDER — CYCLOBENZAPRINE HCL 5 MG PO TABS: ORAL_TABLET | ORAL | 1 refills | Status: DC

## 2015-08-20 NOTE — Patient Instructions (Signed)
Discussed taking ibuprofen 800 mg. 3 x day with food.
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
## 2015-09-17 ENCOUNTER — Encounter: Payer: Self-pay | Admitting: Family Medicine

## 2015-09-17 ENCOUNTER — Ambulatory Visit
Admission: RE | Admit: 2015-09-17 | Discharge: 2015-09-17 | Disposition: A | Discharge status: Home / Self Care | Payer: BLUE CROSS/BLUE SHIELD | Source: Ambulatory Visit | Attending: Family Medicine | Admitting: Family Medicine

## 2015-09-17 ENCOUNTER — Ambulatory Visit (INDEPENDENT_AMBULATORY_CARE_PROVIDER_SITE_OTHER): Payer: BLUE CROSS/BLUE SHIELD | Admitting: Family Medicine

## 2015-09-17 VITALS — BP 114/76 | HR 60 | Temp 98.2°F | Resp 16 | Wt 231.0 lb

## 2015-09-17 DIAGNOSIS — M545 Low back pain, unspecified: Secondary | ICD-10-CM

## 2015-09-17 DIAGNOSIS — M5136 Other intervertebral disc degeneration, lumbar region: Secondary | ICD-10-CM | POA: Diagnosis not present

## 2015-09-17 MED ORDER — PREDNISONE 10 MG PO TABS: ORAL_TABLET | ORAL | 0 refills | Status: DC
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
## 2015-09-17 NOTE — Patient Instructions (Signed)
Stop meloxicam while on prednisone

## 2015-09-18 ENCOUNTER — Telehealth: Payer: Self-pay

## 2015-09-18 NOTE — Telephone Encounter (Signed)
lmtcb Emily Drozdowski, CMA  

## 2015-09-18 NOTE — Telephone Encounter (Signed)
Patient has been advised of report, he wants to know how long he should go at work with no lifting? KW

## 2015-09-18 NOTE — Telephone Encounter (Signed)
-----   Message from Anola Gurneyobert Chauvin, [place] sent at 09/18/2015  7:45 AM EDT ----- No fractures. Mild degenerative changes. Try the prednisone and continue no lifting at work.

## 2015-09-18 NOTE — Telephone Encounter (Signed)
Until the pain resolves-probably another week or two.

## 2015-09-21 NOTE — Telephone Encounter (Signed)
Per patient okay to leave voicemail, left detailed voicemail with advice in regards to lifting. KW

## 2015-11-16 ENCOUNTER — Telehealth: Payer: Self-pay | Admitting: Family Medicine
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
## 2015-11-16 NOTE — Telephone Encounter (Signed)
Discussed office visit this week

## 2015-11-16 NOTE — Telephone Encounter (Signed)
Please review. Thanks!  

## 2015-11-17 ENCOUNTER — Encounter: Payer: Self-pay | Admitting: Family Medicine

## 2015-11-17 ENCOUNTER — Ambulatory Visit (INDEPENDENT_AMBULATORY_CARE_PROVIDER_SITE_OTHER): Payer: BLUE CROSS/BLUE SHIELD | Admitting: Family Medicine

## 2015-11-17 VITALS — BP 102/76 | HR 60 | Temp 98.2°F | Resp 16 | Wt 234.8 lb

## 2015-11-17 DIAGNOSIS — R202 Paresthesia of skin: Secondary | ICD-10-CM

## 2015-11-17 DIAGNOSIS — M545 Low back pain, unspecified: Secondary | ICD-10-CM

## 2015-11-17 DIAGNOSIS — G8929 Other chronic pain: Secondary | ICD-10-CM

## 2015-11-17 MED ORDER — PREDNISONE 20 MG PO TABS: ORAL_TABLET | ORAL | 0 refills | Status: DC

## 2015-11-17 NOTE — Patient Instructions (Signed)
We will call you with the referral information. 
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
## 2016-03-28 IMAGING — CR DG CHEST 2V
2 series · 2 of 2 positions shown · non-contrast
Comparison: None.

CLINICAL DATA: Low midline chest pain radiating to the back, onset
at [DATE].

EXAM:
CHEST  2 VIEW

[w chest pa]
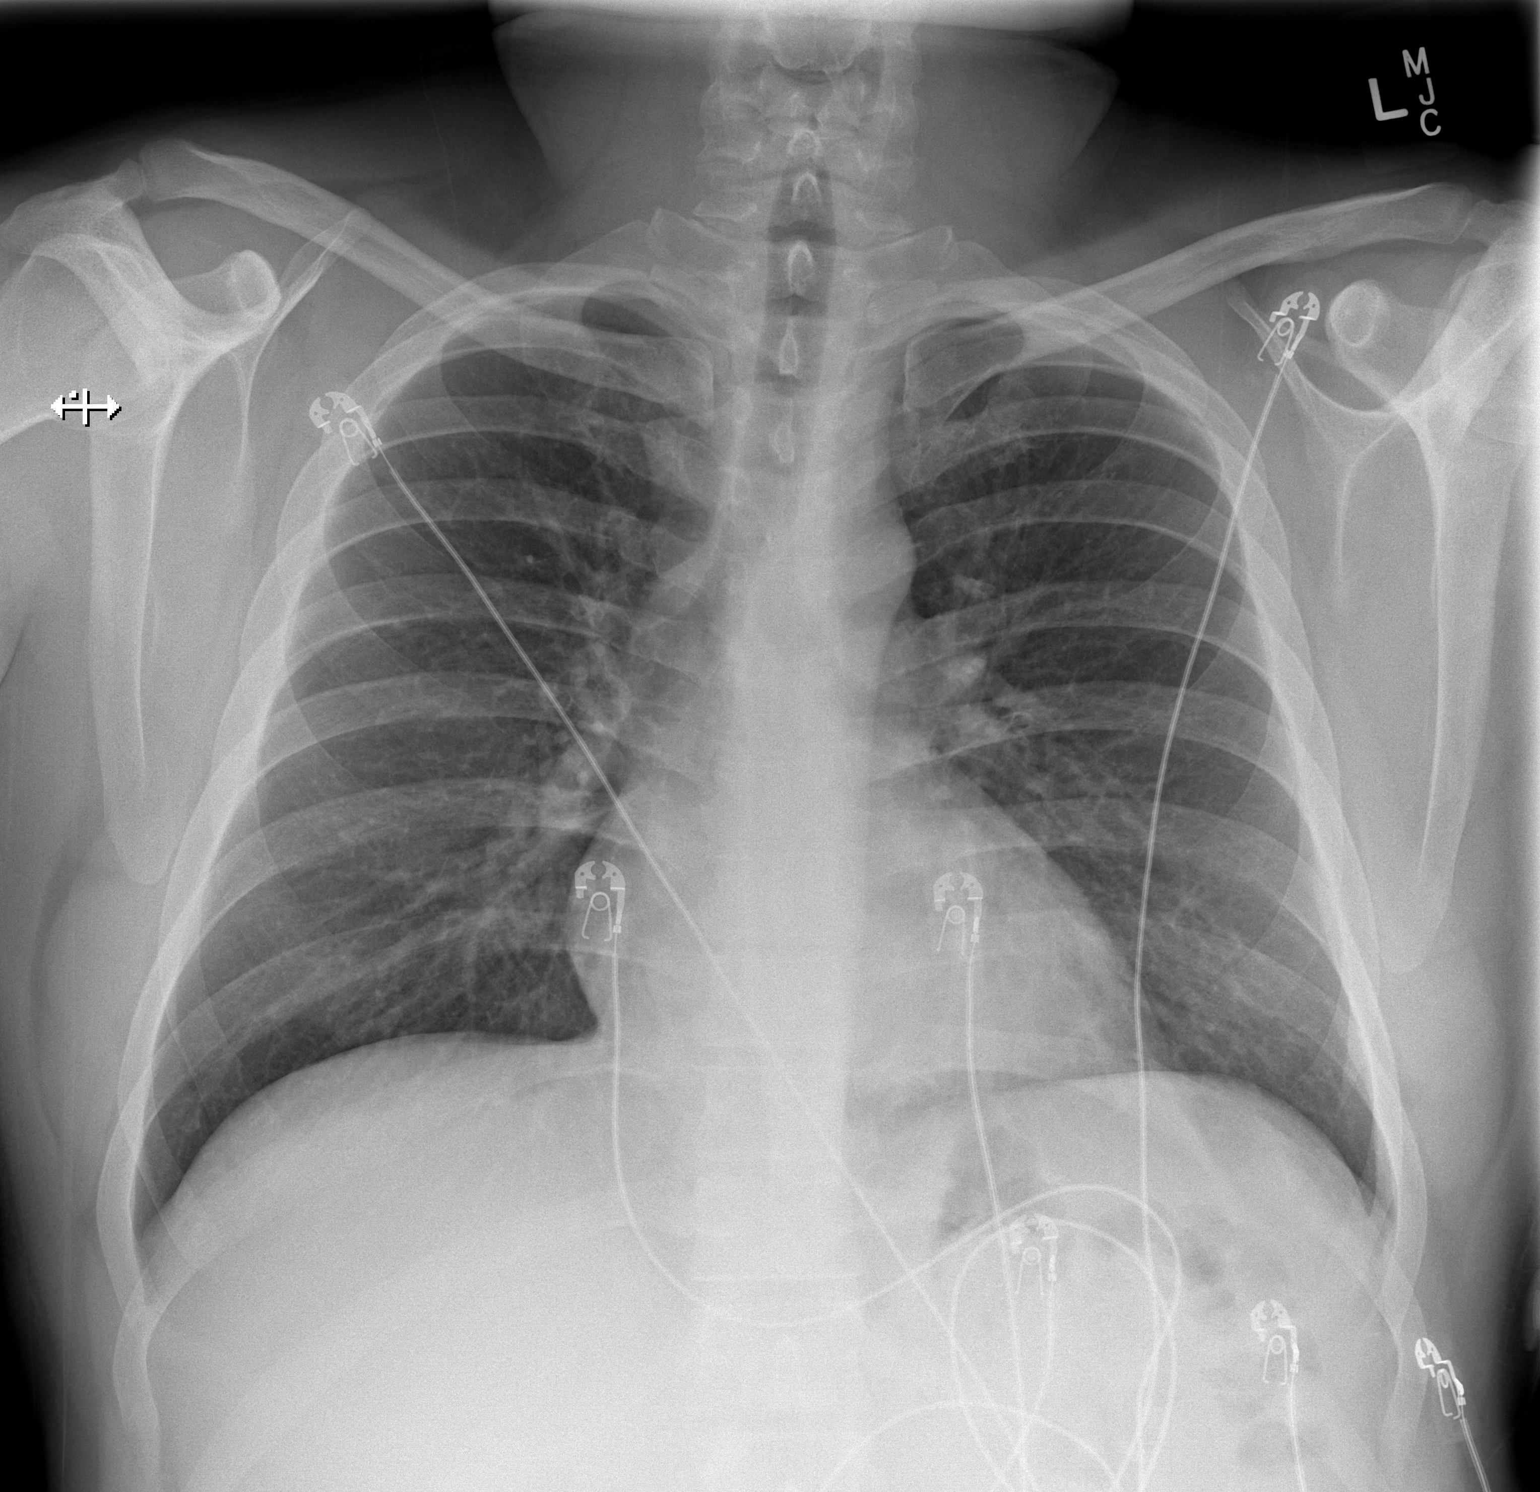

[w chest lat]
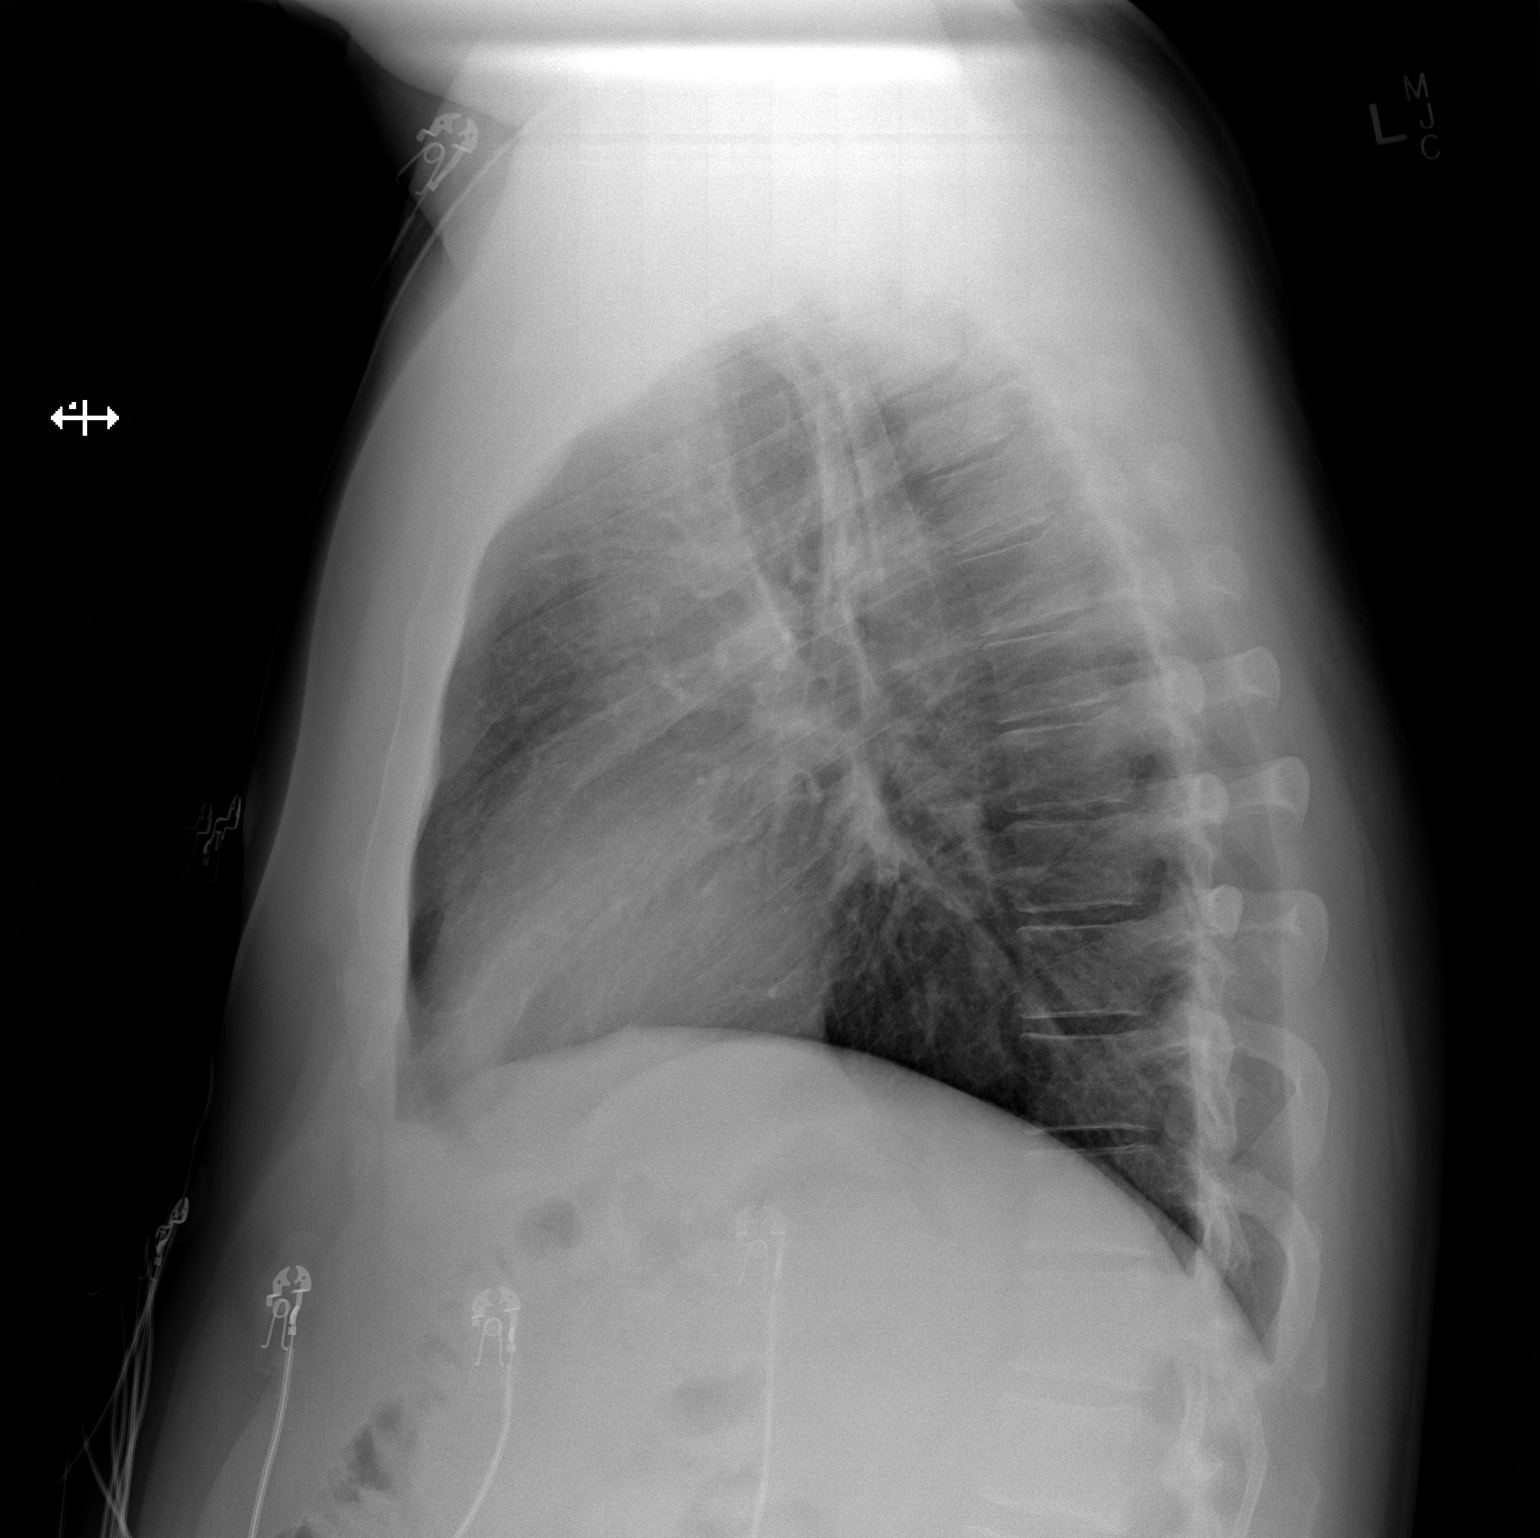

[2 of 2 positions shown; findings below may reference images not displayed]

FINDINGS: The heart size and mediastinal contours are within normal limits.
Both lungs are clear. The visualized skeletal structures are
unremarkable.
IMPRESSION: No active cardiopulmonary disease.

## 2016-12-09 ENCOUNTER — Other Ambulatory Visit: Payer: Self-pay | Admitting: Gastroenterology

## 2016-12-09 DIAGNOSIS — R7989 Other specified abnormal findings of blood chemistry: Secondary | ICD-10-CM

## 2016-12-09 DIAGNOSIS — R945 Abnormal results of liver function studies: Principal | ICD-10-CM

## 2016-12-14 ENCOUNTER — Ambulatory Visit
Admission: RE | Admit: 2016-12-14 | Discharge: 2016-12-14 | Disposition: A | Discharge status: Home / Self Care | Payer: BLUE CROSS/BLUE SHIELD | Source: Ambulatory Visit | Attending: Gastroenterology | Admitting: Gastroenterology

## 2016-12-14 DIAGNOSIS — R1013 Epigastric pain: Secondary | ICD-10-CM | POA: Insufficient documentation

## 2016-12-14 DIAGNOSIS — K8020 Calculus of gallbladder without cholecystitis without obstruction: Secondary | ICD-10-CM | POA: Insufficient documentation

## 2016-12-14 DIAGNOSIS — R945 Abnormal results of liver function studies: Secondary | ICD-10-CM | POA: Diagnosis present

## 2016-12-14 DIAGNOSIS — R7989 Other specified abnormal findings of blood chemistry: Secondary | ICD-10-CM

## 2016-12-14 DIAGNOSIS — K760 Fatty (change of) liver, not elsewhere classified: Secondary | ICD-10-CM | POA: Insufficient documentation

## 2016-12-19 ENCOUNTER — Encounter: Payer: Self-pay | Admitting: *Deleted

## 2016-12-20 ENCOUNTER — Encounter: Payer: Self-pay | Admitting: *Deleted

## 2016-12-20 ENCOUNTER — Ambulatory Visit: Payer: BLUE CROSS/BLUE SHIELD | Admitting: Anesthesiology

## 2016-12-20 ENCOUNTER — Ambulatory Visit
Admission: RE | Admit: 2016-12-20 | Discharge: 2016-12-20 | Disposition: A | Discharge status: Home / Self Care | Payer: BLUE CROSS/BLUE SHIELD | Source: Ambulatory Visit | Attending: Gastroenterology | Admitting: Gastroenterology

## 2016-12-20 ENCOUNTER — Encounter
Admission: RE | Disposition: A | Discharge status: Home / Self Care | Payer: Self-pay | Source: Ambulatory Visit | Attending: Gastroenterology

## 2016-12-20 DIAGNOSIS — R1013 Epigastric pain: Secondary | ICD-10-CM | POA: Insufficient documentation

## 2016-12-20 DIAGNOSIS — K219 Gastro-esophageal reflux disease without esophagitis: Secondary | ICD-10-CM | POA: Diagnosis not present

## 2016-12-20 DIAGNOSIS — K2970 Gastritis, unspecified, without bleeding: Secondary | ICD-10-CM | POA: Insufficient documentation

## 2016-12-20 DIAGNOSIS — Z87891 Personal history of nicotine dependence: Secondary | ICD-10-CM | POA: Diagnosis not present

## 2016-12-20 DIAGNOSIS — Z791 Long term (current) use of non-steroidal anti-inflammatories (NSAID): Secondary | ICD-10-CM | POA: Insufficient documentation

## 2016-12-20 DIAGNOSIS — K319 Disease of stomach and duodenum, unspecified: Secondary | ICD-10-CM | POA: Insufficient documentation

## 2016-12-20 DIAGNOSIS — R197 Diarrhea, unspecified: Secondary | ICD-10-CM | POA: Insufficient documentation

## 2016-12-20 DIAGNOSIS — K210 Gastro-esophageal reflux disease with esophagitis: Secondary | ICD-10-CM | POA: Insufficient documentation

## 2016-12-20 HISTORY — DX: Noninfective gastroenteritis and colitis, unspecified: K52.9

## 2016-12-20 HISTORY — PX: ESOPHAGOGASTRODUODENOSCOPY (EGD) WITH PROPOFOL: SHX5813

## 2016-12-20 HISTORY — DX: Other hemorrhoids: K64.8

## 2016-12-20 HISTORY — DX: Fatty (change of) liver, not elsewhere classified: K76.0

## 2016-12-20 LAB — CBC WITH DIFFERENTIAL/PLATELET
BASOS ABS: 0 10*3/uL (ref 0–0.1)
BASOS PCT: 0 %
EOS ABS: 0.3 10*3/uL (ref 0–0.7)
Eosinophils Relative: 4 %
HCT: 47.7 % (ref 40.0–52.0)
HEMOGLOBIN: 16.1 g/dL (ref 13.0–18.0)
Lymphocytes Relative: 32 %
Lymphs Abs: 2.2 10*3/uL (ref 1.0–3.6)
MCH: 30.1 pg (ref 26.0–34.0)
MCHC: 33.7 g/dL (ref 32.0–36.0)
MCV: 89.2 fL (ref 80.0–100.0)
MONOS PCT: 9 %
Monocytes Absolute: 0.6 10*3/uL (ref 0.2–1.0)
NEUTROS PCT: 55 %
Neutro Abs: 3.8 10*3/uL (ref 1.4–6.5)
Platelets: 213 10*3/uL (ref 150–440)
RBC: 5.35 MIL/uL (ref 4.40–5.90)
RDW: 13.6 % (ref 11.5–14.5)
WBC: 6.9 10*3/uL (ref 3.8–10.6)

## 2016-12-20 LAB — PROTIME-INR
INR: 0.99
PROTHROMBIN TIME: 13 s (ref 11.4–15.2)

## 2016-12-20 SURGERY — ESOPHAGOGASTRODUODENOSCOPY (EGD) WITH PROPOFOL
Anesthesia: General

## 2016-12-20 MED ORDER — EPHEDRINE SULFATE 50 MG/ML IJ SOLN
INTRAMUSCULAR | Status: DC | PRN
  Administered 2016-12-20: 10 mg via INTRAVENOUS

## 2016-12-20 MED ORDER — MIDAZOLAM HCL 2 MG/2ML IJ SOLN
INTRAMUSCULAR | Status: DC | PRN
  Administered 2016-12-20: 2 mg via INTRAVENOUS

## 2016-12-20 MED ORDER — LIDOCAINE HCL (PF) 2 % IJ SOLN
INTRAMUSCULAR | Status: AC
  Filled 2016-12-20: qty 10

## 2016-12-20 MED ORDER — FENTANYL CITRATE (PF) 100 MCG/2ML IJ SOLN
INTRAMUSCULAR | Status: DC | PRN
  Administered 2016-12-20 (×2): 50 ug via INTRAVENOUS

## 2016-12-20 MED ORDER — FENTANYL CITRATE (PF) 100 MCG/2ML IJ SOLN: 25.0000 ug | INTRAMUSCULAR | Status: DC | PRN

## 2016-12-20 MED ORDER — MIDAZOLAM HCL 2 MG/2ML IJ SOLN
INTRAMUSCULAR | Status: AC
  Filled 2016-12-20: qty 2

## 2016-12-20 MED ORDER — PROPOFOL 500 MG/50ML IV EMUL: INTRAVENOUS | Status: DC | PRN

## 2016-12-20 MED ORDER — SODIUM CHLORIDE 0.9 % IV SOLN: INTRAVENOUS | Status: DC

## 2016-12-20 MED ORDER — LIDOCAINE HCL (CARDIAC) 20 MG/ML IV SOLN
INTRAVENOUS | Status: DC | PRN
  Administered 2016-12-20: 50 mg via INTRAVENOUS

## 2016-12-20 MED ORDER — PROPOFOL 500 MG/50ML IV EMUL
INTRAVENOUS | Status: AC
  Filled 2016-12-20: qty 50

## 2016-12-20 MED ORDER — FENTANYL CITRATE (PF) 100 MCG/2ML IJ SOLN
INTRAMUSCULAR | Status: AC
  Filled 2016-12-20: qty 2

## 2016-12-20 MED ORDER — SODIUM CHLORIDE 0.9 % IV SOLN
INTRAVENOUS | Status: DC
  Administered 2016-12-20: 1000 mL via INTRAVENOUS

## 2016-12-20 MED ORDER — BUTAMBEN-TETRACAINE-BENZOCAINE 2-2-14 % EX AERO: 2.0000 | INHALATION_SPRAY | Freq: Once | CUTANEOUS | Status: DC

## 2016-12-20 MED ORDER — PROPOFOL 500 MG/50ML IV EMUL
INTRAVENOUS | Status: DC | PRN
  Administered 2016-12-20: 120 ug/kg/min via INTRAVENOUS

## 2016-12-20 MED ORDER — EPHEDRINE SULFATE 50 MG/ML IJ SOLN
INTRAMUSCULAR | Status: AC
  Filled 2016-12-20: qty 1

## 2016-12-20 MED ORDER — ONDANSETRON HCL 4 MG/2ML IJ SOLN: 4.0000 mg | Freq: Once | INTRAMUSCULAR | Status: DC | PRN

## 2016-12-20 NOTE — Anesthesia Post-op Follow-up Note (Signed)
Anesthesia QCDR form completed.        

## 2016-12-20 NOTE — Anesthesia Preprocedure Evaluation (Signed)
Anesthesia Evaluation  Patient identified by MRN, date of birth, ID band Patient awake    Reviewed: Allergy & Precautions, H&P , NPO status , Patient's Chart, lab work & pertinent test results, reviewed documented beta blocker date and time   History of Anesthesia Complications Negative for: history of anesthetic complications  Airway Mallampati: I  TM Distance: >3 FB Neck ROM: full    Dental no notable dental hx. (+) Teeth Intact   Pulmonary neg shortness of breath, neg sleep apnea, neg COPD, neg recent URI, former smoker,    Pulmonary exam normal breath sounds clear to auscultation       Cardiovascular Exercise Tolerance: Good negative cardio ROS Normal cardiovascular exam Rhythm:regular Rate:Normal     Neuro/Psych negative neurological ROS  negative psych ROS   GI/Hepatic Neg liver ROS, GERD  ,  Endo/Other  negative endocrine ROS  Renal/GU negative Renal ROS  negative genitourinary   Musculoskeletal   Abdominal   Peds  Hematology negative hematology ROS (+)   Anesthesia Other Findings Past Medical History:   GERD (gastroesophageal reflux disease)                       Reproductive/Obstetrics negative OB ROS                             Anesthesia Physical  Anesthesia Plan  ASA: II  Anesthesia Plan: General   Post-op Pain Management:    Induction:   PONV Risk Score and Plan:   Airway Management Planned:   Additional Equipment:   Intra-op Plan:   Post-operative Plan:   Informed Consent: I have reviewed the patients History and Physical, chart, labs and discussed the procedure including the risks, benefits and alternatives for the proposed anesthesia with the patient or authorized representative who has indicated his/her understanding and acceptance.   Dental Advisory Given  Plan Discussed with: Anesthesiologist, CRNA and Surgeon  Anesthesia Plan Comments:          Anesthesia Quick Evaluation

## 2016-12-20 NOTE — H&P (Signed)
Outpatient short stay form Pre-procedure 12/20/2016 9:04 AM Kevin DeemMartin Roach Kevin Sandiford MD  Primary Physician: Dr. Mila Merryonald Fisher  Reason for visit:  EGD  History of present illness:  Patient is a 36 year old male presenting today with a history of epigastric pain and dyspepsia. This seems to be occurring after eating meals.  He also has loose stools. He occasionally takes NSAIDs but denies use of any aspirin products.    Current Facility-Administered Medications:  .  0.9 %  sodium chloride infusion, , Intravenous, Continuous, Kevin DeemSkulskie, Kevin Hakimian U, MD, Last Rate: 20 mL/hr at 12/20/16 0803, 1,000 mL at 12/20/16 0803 .  0.9 %  sodium chloride infusion, , Intravenous, Continuous, Kevin DeemSkulskie, Kevin Cotroneo U, MD  Medications Prior to Admission  Medication Sig Dispense Refill Last Dose  . pantoprazole (PROTONIX) 40 MG tablet Take 40 mg by mouth daily.     . meloxicam (MOBIC) 15 MG tablet Take 15 mg by mouth daily.   Taking  . [DISCONTINUED] omeprazole (PRILOSEC) 20 MG capsule Take 1 capsule (20 mg total) by mouth daily. 30 capsule 2 Taking  . [DISCONTINUED] predniSONE (DELTASONE) 20 MG tablet Taper as follows: 3 pills for 4 days, two pills for 4 days, one pill for four days 24 tablet 0      Allergies  Allergen Reactions  . Ragwitek  [Short Ragweed Pollen Ext]     Other reaction(s): Cough     Past Medical History:  Diagnosis Date  . Chronic diarrhea   . GERD (gastroesophageal reflux disease)   . Hemorrhoids, internal   . Hepatic steatosis     Review of systems:      Physical Exam    Heart and lungs: Regular rate and rhythm without rub or gallop, lungs are bilaterally clear.    HEENT: Normocephalic atraumatic eyes are anicteric    Other:     Pertinant exam for procedure: Soft nontender nondistended bowel sounds positive normoactive    Planned proceedures: EGD and indicated procedures.  I have discussed the risks benefits and complications of procedures to include not limited to bleeding,  infection, perforation and the risk of sedation and the patient wishes to proceed.     Kevin DeemMartin Roach Kevin Schadt, MD Gastroenterology 12/20/2016  9:04 AM

## 2016-12-20 NOTE — Anesthesia Procedure Notes (Signed)
Performed by: Cook-Martin, Kolbie Lepkowski Pre-anesthesia Checklist: Patient identified, Emergency Drugs available, Suction available, Patient being monitored and Timeout performed Patient Re-evaluated:Patient Re-evaluated prior to induction Oxygen Delivery Method: Nasal cannula Preoxygenation: Pre-oxygenation with 100% oxygen Induction Type: IV induction Airway Equipment and Method: Bite block Placement Confirmation: positive ETCO2 and CO2 detector       

## 2016-12-20 NOTE — Transfer of Care (Signed)
Immediate Anesthesia Transfer of Care Note  Patient: Kevin Roach  Procedure(s) Performed: ESOPHAGOGASTRODUODENOSCOPY (EGD) WITH PROPOFOL (N/A )  Patient Location: PACU  Anesthesia Type:General  Level of Consciousness: awake and sedated  Airway & Oxygen Therapy: Patient Spontanous Breathing and Patient connected to nasal cannula oxygen  Post-op Assessment: Report given to RN and Post -op Vital signs reviewed and stable  Post vital signs: Reviewed and stable  Last Vitals:  Vitals:   12/20/16 0716  BP: 128/80  Pulse: 63  Resp: 20  Temp: 36.6 C  SpO2: 97%    Last Pain:  Vitals:   12/20/16 0716  TempSrc: Tympanic         Complications: No apparent anesthesia complications

## 2016-12-20 NOTE — Anesthesia Postprocedure Evaluation (Signed)
Anesthesia Post Note  Patient: Kevin Roach  Procedure(s) Performed: ESOPHAGOGASTRODUODENOSCOPY (EGD) WITH PROPOFOL (N/A )  Patient location during evaluation: PACU Anesthesia Type: General Level of consciousness: awake and alert and oriented Pain management: pain level controlled Vital Signs Assessment: post-procedure vital signs reviewed and stable Respiratory status: spontaneous breathing Cardiovascular status: blood pressure returned to baseline Anesthetic complications: no     Last Vitals:  Vitals:   12/20/16 0959 12/20/16 1009  BP: 111/77 115/80  Pulse: 66 66  Resp: (!) 21 (!) 22  Temp:    SpO2: 93% 96%    Last Pain:  Vitals:   12/20/16 1009  TempSrc:   PainSc: 0-No pain                 Lane Kjos

## 2016-12-20 NOTE — Op Note (Signed)
[facility] Gastroenterology Patient Name: Kevin ScotRobert Dechellis Procedure Date: 12/20/2016 9:06 AM MRN: [id number] Account #: [id number] Date of Birth: 1980/09/22 Admit Type: Outpatient Age: [age] Room[facility] ENDO ROOM 3 Gender: Male Note Status: Finalized Procedure:            Upper GI endoscopy Indications:          Epigastric abdominal pain, Dyspepsia Providers:            Christena DeemMartin U. Delmos Velaquez, MD Referring MD:         Les Pouobert S. Benancio Deedshauvin, MD (Referring MD) Medicines:            Monitored Anesthesia Care Complications:        No immediate complications. Procedure:            Pre-Anesthesia Assessment:                       - ASA Grade Assessment: II - A patient with mild                        systemic disease.                       After obtaining informed consent, the endoscope was                        passed under direct vision. Throughout the procedure,                        the patient's blood pressure, pulse, and oxygen                        saturations were monitored continuously. The Endoscope                        was introduced through the mouth, and advanced to the                        fourth part of duodenum. The upper GI endoscopy was                        accomplished without difficulty. The patient tolerated                        the procedure well. Findings:      LA Grade A (one or more mucosal breaks less than 5 mm, not extending       between tops of 2 mucosal folds) esophagitis with no bleeding was found.       Biopsies were taken with a cold forceps for histology.      The exam of the esophagus was otherwise normal.      Patchy minimal inflammation characterized by erythema was found in the       gastric body. Biopsies were taken with a cold forceps for histology.      The examined duodenum was normal. Biopsies were taken with a cold       forceps for histology. Impression:           - LA Grade A erosive esophagitis. Biopsied.       - Gastritis. Biopsied.                       -  Normal examined duodenum. Biopsied. Recommendation:       - Discharge patient to home.                       - Use Protonix (pantoprazole) 40 mg PO daily daily. Procedure Code(s):    --- Professional ---                       [phone], Esophagogastroduodenoscopy, flexible, transoral;                        with biopsy, single or multiple Diagnosis Code(s):    --- Professional ---                       K20.8, Other esophagitis                       K29.70, Gastritis, unspecified, without bleeding                       R10.13, Epigastric pain CPT copyright 2016 American Medical Association. All rights reserved. The codes documented in this report are preliminary and upon coder review may  be revised to meet current compliance requirements. Christena DeemMartin U Riyanshi Wahab, MD 12/20/2016 9:40:47 AM This report has been signed electronically. Number of Addenda: 0 Note Initiated On: 12/20/2016 9:06 AM      [facility]

## 2016-12-21 ENCOUNTER — Encounter: Payer: Self-pay | Admitting: Gastroenterology

## 2016-12-22 LAB — SURGICAL PATHOLOGY

## 2017-11-23 ENCOUNTER — Other Ambulatory Visit: Payer: Self-pay | Admitting: Gastroenterology

## 2017-11-23 DIAGNOSIS — R748 Abnormal levels of other serum enzymes: Secondary | ICD-10-CM

## 2017-11-28 ENCOUNTER — Ambulatory Visit: Payer: BLUE CROSS/BLUE SHIELD

## 2017-12-01 ENCOUNTER — Ambulatory Visit: Payer: BLUE CROSS/BLUE SHIELD

## 2017-12-08 ENCOUNTER — Ambulatory Visit
Admission: RE | Admit: 2017-12-08 | Discharge: 2017-12-08 | Disposition: A | Discharge status: Home / Self Care | Payer: BLUE CROSS/BLUE SHIELD | Source: Ambulatory Visit | Attending: Gastroenterology | Admitting: Gastroenterology

## 2017-12-08 DIAGNOSIS — R748 Abnormal levels of other serum enzymes: Secondary | ICD-10-CM

## 2017-12-08 DIAGNOSIS — K828 Other specified diseases of gallbladder: Secondary | ICD-10-CM | POA: Insufficient documentation

## 2018-10-11 ENCOUNTER — Ambulatory Visit (INDEPENDENT_AMBULATORY_CARE_PROVIDER_SITE_OTHER): Payer: BLUE CROSS/BLUE SHIELD | Admitting: Physician Assistant

## 2018-10-11 ENCOUNTER — Other Ambulatory Visit: Payer: Self-pay

## 2018-10-11 VITALS — Wt 230.0 lb

## 2018-10-11 DIAGNOSIS — R509 Fever, unspecified: Secondary | ICD-10-CM

## 2018-10-11 NOTE — Progress Notes (Signed)
       Patient: Kevin Roach Male    DOB: 06-10-1980   38 y.o.   MRN: [id number] Visit Date: 10/11/2018  Today's Provider: Trinna Post, PA-C   No chief complaint on file.  Subjective:    Virtual Visit via Telephone Note  I connected with Kevin Roach on 10/11/18 at  1:40 PM EDT by telephone and verified that I am speaking with the correct person using two identifiers.   I discussed the limitations, risks, security and privacy concerns of performing an evaluation and management service by telephone and the availability of in person appointments. I also discussed with the patient that there may be a patient responsible charge related to this service. The patient expressed understanding and agreed to proceed.  Patient location: home Provider location: [place] office  Persons involved in the visit: patient, provider    HPI   Patient presenting today after exposure to coworker who was confirmed positive 9 days ago. Patient reports today his wife has a fever and now he feels a little warm. Was working with confirmed positive patient outside, not wearing masks. Worked closely with this person for three days. No cough, no shortness of breath.   Allergies  Allergen Reactions  . Ragwitek  [Short Ragweed Pollen Ext]     Other reaction(s): Cough     Current Outpatient Medications:  .  pantoprazole (PROTONIX) 40 MG tablet, Take 40 mg by mouth daily., Disp: , Rfl:  .  meloxicam (MOBIC) 15 MG tablet, Take 15 mg by mouth daily., Disp: , Rfl:  .  pantoprazole (PROTONIX) 40 MG tablet, Take 40 mg by mouth daily., Disp: , Rfl:   Review of Systems  Constitutional: Positive for fatigue. Negative for activity change, appetite change, chills, diaphoresis, fever and unexpected weight change.  Respiratory: Positive for cough and shortness of breath. Negative for apnea, choking, chest tightness, wheezing and stridor.   Gastrointestinal: Negative.   Musculoskeletal:  Negative.   Neurological: Positive for headaches.    Social History   Tobacco Use  . Smoking status: Former Smoker    Types: Cigarettes  . Smokeless tobacco: Former Systems developer    Types: Snuff    Quit date: 06/25/2014  Substance Use Topics  . Alcohol use: Yes    Alcohol/week: 0.0 standard drinks    Comment: Usually drinks once a week. Last drink: Tonight      Objective:   Wt 230 lb (104.3 kg)   BMI 33.97 kg/m  Vitals:   10/11/18 1134  Weight: 230 lb (104.3 kg)  Body mass index is 33.97 kg/m.   Physical Exam   No results found for any visits on 10/11/18.     Assessment & Plan    1. Fever, unspecified fever cause  - Novel Coronavirus, NAA ([facility])  The entirety of the information documented in the History of Present Illness, Review of Systems and Physical Exam were personally obtained by me. Portions of this information were initially documented by Ashley Royalty, CMA and reviewed by me for thoroughness and accuracy.       Trinna Post, PA-C  [facility]

## 2018-10-11 NOTE — Patient Instructions (Signed)
This information is directly available on the CDC website: https://www.cdc.gov/coronavirus/2019-ncov/if-you-are-sick/steps-when-sick.html    Source:CDC Reference to specific commercial products, manufacturers, companies, or trademarks does not constitute its endorsement or recommendation by the U.S. Government, Department of Health and Human Services, or Centers for Disease Control and Prevention.  

## 2018-10-22 ENCOUNTER — Other Ambulatory Visit: Payer: Self-pay

## 2018-10-22 ENCOUNTER — Ambulatory Visit (INDEPENDENT_AMBULATORY_CARE_PROVIDER_SITE_OTHER): Payer: Self-pay | Admitting: Physician Assistant

## 2018-10-22 DIAGNOSIS — M545 Low back pain, unspecified: Secondary | ICD-10-CM

## 2018-10-22 MED ORDER — CYCLOBENZAPRINE HCL 5 MG PO TABS: 5.0000 mg | ORAL_TABLET | Freq: Three times a day (TID) | ORAL | 1 refills | Status: DC | PRN

## 2018-10-22 MED ORDER — MELOXICAM 15 MG PO TABS: 15.0000 mg | ORAL_TABLET | Freq: Every day | ORAL | 0 refills | Status: DC

## 2018-10-22 NOTE — Progress Notes (Signed)
Patient: Kevin Roach Male    DOB: 01-05-1981   38 y.o.   MRN: [id number] Visit Date: 10/22/2018  Today's Provider: Trey Sailors, PA-C   Chief Complaint  Patient presents with  . Back Pain    Lower back; recurrent worsening in the last week.    Subjective:    Virtual Visit via Video Note  I connected with Kevin Roach on 10/22/18 at  1:20 PM EDT by a video enabled telemedicine application and verified that I am speaking with the correct person using two identifiers.   I discussed the limitations of evaluation and management by telemedicine and the availability of in person appointments. The patient expressed understanding and agreed to proceed.   Patient location: home Provider location: [facility]/home office  Persons involved in the visit: patient, provider    Back Pain This is a recurrent problem. The current episode started in the past 7 days. The problem has been gradually worsening since onset. The pain is present in the lumbar spine. The quality of the pain is described as aching and shooting. The pain does not radiate. The symptoms are aggravated by position, twisting and lying down. Associated symptoms include weakness. Pertinent negatives include no numbness. He has tried NSAIDs (Pt states Meloxicam works the best for is back pain.) for the symptoms. The treatment provided moderate relief.   Patient reports he was lifting and tugging last week, pain has worsened in the last week. He finds it difficult to transition from sitting to standing position. Feels pain radiating down his leg, denies numbness in groin. Denies incontinence.   Allergies  Allergen Reactions  . Ragwitek  [Short Ragweed Pollen Ext]     Other reaction(s): Cough     Current Outpatient Medications:  .  pantoprazole (PROTONIX) 40 MG tablet, Take 40 mg by mouth daily., Disp: , Rfl:  .  meloxicam (MOBIC) 15 MG tablet, Take 15 mg by mouth daily., Disp: , Rfl:  .   pantoprazole (PROTONIX) 40 MG tablet, Take 40 mg by mouth daily., Disp: , Rfl:   Review of Systems  Constitutional: Negative.   Musculoskeletal: Positive for back pain. Negative for arthralgias, gait problem, joint swelling, myalgias, neck pain and neck stiffness.  Neurological: Positive for weakness. Negative for numbness.    Social History   Tobacco Use  . Smoking status: Former Smoker    Types: Cigarettes  . Smokeless tobacco: Former Neurosurgeon    Types: Snuff    Quit date: 06/25/2014  Substance Use Topics  . Alcohol use: Yes    Alcohol/week: 0.0 standard drinks    Comment: Usually drinks once a week. Last drink: Tonight      Objective:   There were no vitals taken for this visit. There were no vitals filed for this visit.There is no height or weight on file to calculate BMI.   Physical Exam Constitutional:      General: He is not in acute distress.    Appearance: Normal appearance. He is not ill-appearing.  Skin:    General: Skin is warm and dry.  Neurological:     Mental Status: He is alert and oriented to person, place, and time. Mental status is at baseline.  Psychiatric:        Mood and Affect: Mood normal.        Behavior: Behavior normal.      No results found for any visits on 10/22/18.     Assessment & Plan  1. Acute bilateral low back pain without sciatica  - cyclobenzaprine (FLEXERIL) 5 MG tablet; Take 1 tablet (5 mg total) by mouth 3 (three) times daily as needed for muscle spasms.  Dispense: 30 tablet; Refill: 1 - meloxicam (MOBIC) 15 MG tablet; Take 1 tablet (15 mg total) by mouth daily.  Dispense: 30 tablet; Refill: 0  The entirety of the information documented in the History of Present Illness, Review of Systems and Physical Exam were personally obtained by me. Portions of this information were initially documented by Ashley Royalty, CMA and reviewed by me for thoroughness and accuracy.   F/u PRN     Trinna Post, PA-C  [facility]

## 2018-10-22 NOTE — Patient Instructions (Signed)
Acute Back Pain, Adult Acute back pain is sudden and usually short-lived. It is often caused by an injury to the muscles and tissues in the back. The injury may result from:  A muscle or ligament getting overstretched or torn (strained). Ligaments are tissues that connect bones to each other. Lifting something improperly can cause a back strain.  Wear and tear (degeneration) of the spinal disks. Spinal disks are circular tissue that provides cushioning between the bones of the spine (vertebrae).  Twisting motions, such as while playing sports or doing yard work.  A hit to the back.  Arthritis. You may have a physical exam, lab tests, and imaging tests to find the cause of your pain. Acute back pain usually goes away with rest and home care. Follow these instructions at home: Managing pain, stiffness, and swelling  Take over-the-counter and prescription medicines only as told by your health care provider.  Your health care provider may recommend applying ice during the first 24-48 hours after your pain starts. To do this: ? Put ice in a plastic bag. ? Place a towel between your skin and the bag. ? Leave the ice on for 20 minutes, 2-3 times a day.  If directed, apply heat to the affected area as often as told by your health care provider. Use the heat source that your health care provider recommends, such as a moist heat pack or a heating pad. ? Place a towel between your skin and the heat source. ? Leave the heat on for 20-30 minutes. ? Remove the heat if your skin turns bright red. This is especially important if you are unable to feel pain, heat, or cold. You have a greater risk of getting burned. Activity   Do not stay in bed. Staying in bed for more than 1-2 days can delay your recovery.  Sit up and stand up straight. Avoid leaning forward when you sit, or hunching over when you stand. ? If you work at a desk, sit close to it so you do not need to lean over. Keep your chin tucked  in. Keep your neck drawn back, and keep your elbows bent at a right angle. Your arms should look like the letter "L." ? Sit high and close to the steering wheel when you drive. Add lower back (lumbar) support to your car seat, if needed.  Take short walks on even surfaces as soon as you are able. Try to increase the length of time you walk each day.  Do not sit, drive, or stand in one place for more than 30 minutes at a time. Sitting or standing for long periods of time can put stress on your back.  Do not drive or use heavy machinery while taking prescription pain medicine.  Use proper lifting techniques. When you bend and lift, use positions that put less stress on your back: ? Bend your knees. ? Keep the load close to your body. ? Avoid twisting.  Exercise regularly as told by your health care provider. Exercising helps your back heal faster and helps prevent back injuries by keeping muscles strong and flexible.  Work with a physical therapist to make a safe exercise program, as recommended by your health care provider. Do any exercises as told by your physical therapist. Lifestyle  Maintain a healthy weight. Extra weight puts stress on your back and makes it difficult to have good posture.  Avoid activities or situations that make you feel anxious or stressed. Stress and anxiety increase muscle   tension and can make back pain worse. Learn ways to manage anxiety and stress, such as through exercise. General instructions  Sleep on a firm mattress in a comfortable position. Try lying on your side with your knees slightly bent. If you lie on your back, put a pillow under your knees.  Follow your treatment plan as told by your health care provider. This may include: ? Cognitive or behavioral therapy. ? Acupuncture or massage therapy. ? Meditation or yoga. Contact a health care provider if:  You have pain that is not relieved with rest or medicine.  You have increasing pain going down  into your legs or buttocks.  Your pain does not improve after 2 weeks.  You have pain at night.  You lose weight without trying.  You have a fever or chills. Get help right away if:  You develop new bowel or bladder control problems.  You have unusual weakness or numbness in your arms or legs.  You develop nausea or vomiting.  You develop abdominal pain.  You feel faint. Summary  Acute back pain is sudden and usually short-lived.  Use proper lifting techniques. When you bend and lift, use positions that put less stress on your back.  Take over-the-counter and prescription medicines and apply heat or ice as directed by your health care provider. This information is not intended to replace advice given to you by your health care provider. Make sure you discuss any questions you have with your health care provider. Document Released: 01/10/2005 Document Revised: 05/01/2018 Document Reviewed: 08/24/2016 Elsevier Patient Education  2020 [employer]  

## 2018-11-13 ENCOUNTER — Other Ambulatory Visit: Payer: Self-pay | Admitting: Physician Assistant

## 2018-11-13 DIAGNOSIS — M545 Low back pain, unspecified: Secondary | ICD-10-CM

## 2018-12-26 ENCOUNTER — Other Ambulatory Visit: Payer: Self-pay | Admitting: Physician Assistant

## 2018-12-26 DIAGNOSIS — M545 Low back pain, unspecified: Secondary | ICD-10-CM

## 2019-01-31 ENCOUNTER — Other Ambulatory Visit: Payer: Self-pay | Admitting: Physician Assistant

## 2019-01-31 DIAGNOSIS — M545 Low back pain, unspecified: Secondary | ICD-10-CM

## 2019-03-08 ENCOUNTER — Other Ambulatory Visit: Payer: Self-pay | Admitting: Physician Assistant

## 2019-03-08 DIAGNOSIS — M545 Low back pain, unspecified: Secondary | ICD-10-CM

## 2019-09-13 ENCOUNTER — Other Ambulatory Visit: Payer: Self-pay | Admitting: Physician Assistant

## 2019-09-13 DIAGNOSIS — M545 Low back pain, unspecified: Secondary | ICD-10-CM

## 2020-01-24 ENCOUNTER — Other Ambulatory Visit: Payer: Self-pay | Admitting: Physician Assistant

## 2020-01-24 DIAGNOSIS — M545 Low back pain, unspecified: Secondary | ICD-10-CM

## 2020-01-24 NOTE — Telephone Encounter (Signed)
Requested medications are due for refill today yes  Requested medications are on the active medication list yes  Last refill 10/6  Last visit 09/2019 addressed and given 30 with no mention of continuing.   Future visit scheduled no  Notes to clinic Unclear if was to be continued, please assess.

## 2020-02-05 NOTE — Telephone Encounter (Signed)
Not refilling, will be discussing this issue with [facility].

## 2020-11-06 ENCOUNTER — Encounter: Payer: Self-pay | Admitting: Family Medicine

## 2020-11-27 ENCOUNTER — Ambulatory Visit (INDEPENDENT_AMBULATORY_CARE_PROVIDER_SITE_OTHER): Payer: BC Managed Care – PPO | Admitting: Physician Assistant

## 2020-11-27 ENCOUNTER — Encounter: Payer: Self-pay | Admitting: Physician Assistant

## 2020-11-27 ENCOUNTER — Other Ambulatory Visit: Payer: Self-pay

## 2020-11-27 VITALS — BP 120/79 | HR 67 | Temp 98.7°F | Resp 16 | Ht 67.0 in | Wt 243.0 lb

## 2020-11-27 DIAGNOSIS — K580 Irritable bowel syndrome with diarrhea: Secondary | ICD-10-CM

## 2020-11-27 DIAGNOSIS — Z1159 Encounter for screening for other viral diseases: Secondary | ICD-10-CM

## 2020-11-27 DIAGNOSIS — Z114 Encounter for screening for human immunodeficiency virus [HIV]: Secondary | ICD-10-CM | POA: Diagnosis not present

## 2020-11-27 DIAGNOSIS — Z0000 Encounter for general adult medical examination without abnormal findings: Secondary | ICD-10-CM | POA: Diagnosis not present

## 2020-11-27 NOTE — Progress Notes (Signed)
Complete physical exam   Patient: Kevin Roach   DOB: 01/06/1981   40 y.o. Male  MRN: [id number] Visit Date: 11/27/2020  Today's healthcare provider: Alfredia Ferguson, PA-C   Chief Complaint  Patient presents with   Annual Exam    Subjective    [name] is a 40 y.o. male who presents today for a complete physical exam.  He reports consuming a general diet. The patient has a physically strenuous job, but has no regular exercise apart from work.  He generally feels well. He reports sleeping fairly well. He does not have additional problems to discuss today.  Declined Influenza vaccine today.  Past Medical History:  Diagnosis Date   Chronic diarrhea    GERD (gastroesophageal reflux disease)    Hemorrhoids, internal    Hepatic steatosis    Past Surgical History:  Procedure Laterality Date   COLONOSCOPY  10/03/2014   COLONOSCOPY WITH PROPOFOL N/A 10/03/2014   Procedure: COLONOSCOPY WITH PROPOFOL;  Surgeon: Elnita Maxwell, MD;  Location: [facility] ENDOSCOPY;  Service: Endoscopy;  Laterality: N/A;   ESOPHAGOGASTRODUODENOSCOPY (EGD) WITH PROPOFOL N/A 12/20/2016   Procedure: ESOPHAGOGASTRODUODENOSCOPY (EGD) WITH PROPOFOL;  Surgeon: Christena Deem, MD;  Location: [facility] ENDOSCOPY;  Service: Endoscopy;  Laterality: N/A;   NO PAST SURGERIES     Social History   Socioeconomic History   Marital status: Single    Spouse name: Not on file   Number of children: Not on file   Years of education: Not on file   Highest education level: Not on file  Occupational History   Not on file  Tobacco Use   Smoking status: Former    Types: Cigarettes   Smokeless tobacco: Former    Types: Snuff    Quit date: 06/25/2014  Vaping Use   Vaping Use: Never used  Substance and Sexual Activity   Alcohol use: Yes    Alcohol/week: 0.0 standard drinks    Comment: Usually drinks once a week. Last drink: Tonight   Drug use: No   Sexual activity: Not on file  Other Topics Concern   Not on  file  Social History Narrative   Not on file   Social Determinants of Health   Financial Resource Strain: Not on file  Food Insecurity: Not on file  Transportation Needs: Not on file  Physical Activity: Not on file  Stress: Not on file  Social Connections: Not on file  Intimate Partner Violence: Not on file   Family Status  Relation Name Status   Mother  Alive   Father  Deceased   Brother  Alive   Brother  Alive   Family History  Problem Relation Age of Onset   Healthy Mother    Alcohol abuse Father    Healthy Brother    Healthy Brother    Allergies  Allergen Reactions   Ragwitek  [Short Ragweed Pollen Ext]     Other reaction(s): Cough    Patient Care Team: Alfredia Ferguson, PA-C as PCP - General (Physician Assistant)   Medications: Outpatient Medications Prior to Visit  Medication Sig   cyclobenzaprine (FLEXERIL) 5 MG tablet Take 1 tablet (5 mg total) by mouth 3 (three) times daily as needed for muscle spasms.   meloxicam (MOBIC) 15 MG tablet TAKE 1 TABLET BY MOUTH EVERY DAY   [DISCONTINUED] pantoprazole (PROTONIX) 40 MG tablet Take 40 mg by mouth daily.   pantoprazole (PROTONIX) 40 MG tablet Take 40 mg by mouth daily.   No  facility-administered medications prior to visit.    Review of Systems  Gastrointestinal:  Positive for diarrhea (IBS).  Genitourinary:  Positive for frequency.  All other systems reviewed and are negative.  Reports recent annual blood work at work, does not have results yet.  Objective    BP 120/79 (BP Location: Left Arm, Patient Position: Sitting, Cuff Size: Large)   Pulse 67   Temp 98.7 F (37.1 C) (Oral)   Resp 16   Ht 5\' 7"  (1.702 m)   Wt 243 lb (110.2 kg)   BMI 38.06 kg/m   Physical Exam Constitutional:      General: He is awake.     Appearance: He is well-developed.  HENT:     Head: Normocephalic.     Right Ear: Tympanic membrane, ear canal and external ear normal.     Left Ear: Tympanic membrane, ear canal and  external ear normal.     Nose: Nose normal. No congestion or rhinorrhea.     Mouth/Throat:     Mouth: Mucous membranes are moist.     Pharynx: No oropharyngeal exudate or posterior oropharyngeal erythema.  Eyes:     Pupils: Pupils are equal, round, and reactive to light.  Cardiovascular:     Rate and Rhythm: Normal rate and regular rhythm.     Heart sounds: Normal heart sounds.  Pulmonary:     Effort: Pulmonary effort is normal.     Breath sounds: Normal breath sounds.  Abdominal:     General: There is no distension.     Palpations: Abdomen is soft.     Tenderness: There is no abdominal tenderness. There is no guarding.  Musculoskeletal:     Cervical back: Normal range of motion.     Right lower leg: No edema.     Left lower leg: No edema.  Lymphadenopathy:     Cervical: No cervical adenopathy.  Skin:    General: Skin is warm.  Neurological:     Mental Status: He is alert and oriented to person, place, and time.  Psychiatric:        Attention and Perception: Attention normal.        Mood and Affect: Mood normal.        Speech: Speech normal.        Behavior: Behavior normal. Behavior is cooperative.   Last depression screening scores PHQ 2/9 Scores 11/27/2020  PHQ - 2 Score 0   Last fall risk screening Fall Risk  11/27/2020  Falls in the past year? 0  Number falls in past yr: 0  Injury with Fall? 0   Last Audit-C alcohol use screening Alcohol Use Disorder Test (AUDIT) 11/27/2020  1. How often do you have a drink containing alcohol? 3  2. How many drinks containing alcohol do you have on a typical day when you are drinking? 2  3. How often do you have six or more drinks on one occasion? 1  AUDIT-C Score 6  4. How often during the last year have you found that you were not able to stop drinking once you had started? 0  5. How often during the last year have you failed to do what was normally expected from you because of drinking? 0  6. How often during the last year have  you needed a first drink in the morning to get yourself going after a heavy drinking session? 0  7. How often during the last year have you had a feeling of guilt of remorse  after drinking? 0  8. How often during the last year have you been unable to remember what happened the night before because you had been drinking? 0  9. Have you or someone else been injured as a result of your drinking? 0  10. Has a relative or friend or a doctor or another health worker been concerned about your drinking or suggested you cut down? 0  Alcohol Use Disorder Identification Test Final Score (AUDIT) 6   A score of 3 or more in women, and 4 or more in men indicates increased risk for alcohol abuse, EXCEPT if all of the points are from question 1   No results found for any visits on 11/27/20.  Assessment & Plan    Routine Health Maintenance and Physical Exam  Exercise Activities and Dietary recommendations  Goals   None     Immunization History  Administered Date(s) Administered   Tdap 01/30/2013    Health Maintenance  Topic Date Due   COVID-19 Vaccine (1) Never done   Hepatitis C Screening  Never done   INFLUENZA VACCINE  Never done   TETANUS/TDAP  01/31/2023   HIV Screening  Completed   Pneumococcal Vaccine 41-71 Years old  Aged Out   HPV VACCINES  Aged Out    Discussed health benefits of physical activity, and encouraged him to engage in regular exercise appropriate for his age and condition. Problem List Items Addressed This Visit       Digestive   IBS (irritable bowel syndrome)    Chronic and stable.       Other Visit Diagnoses     Encounter for physical examination    -  Primary   Relevant Orders   Hepatitis C antibody   HIV Antibody (routine testing w rflx)   Encounter for screening for HIV       Relevant Orders   HIV Antibody (routine testing w rflx)   Encounter for hepatitis C screening test for low risk patient       Relevant Orders   Hepatitis C antibody         Return in about 1 year (around 11/27/2021) for CPE.     I, Alfredia Ferguson, PA-C have reviewed all documentation for this visit. The documentation on  11/27/2020 for the exam, diagnosis, procedures, and orders are all accurate and complete.    Alfredia Ferguson, PA-C  [facility] [phone] (phone) [phone] (fax)  [facility]

## 2020-11-27 NOTE — Assessment & Plan Note (Signed)
Chronic and stable.   

## 2020-11-28 LAB — HIV ANTIBODY (ROUTINE TESTING W REFLEX): HIV Screen 4th Generation wRfx: NONREACTIVE

## 2020-11-28 LAB — HEPATITIS C ANTIBODY: Hep C Virus Ab: <0.1 s/co ratio (ref 0.0–0.9)

## 2021-05-14 DIAGNOSIS — K760 Fatty (change of) liver, not elsewhere classified: Secondary | ICD-10-CM | POA: Diagnosis not present

## 2021-05-14 DIAGNOSIS — R748 Abnormal levels of other serum enzymes: Secondary | ICD-10-CM | POA: Diagnosis not present

## 2021-05-14 DIAGNOSIS — K580 Irritable bowel syndrome with diarrhea: Secondary | ICD-10-CM | POA: Diagnosis not present

## 2021-05-14 DIAGNOSIS — K2100 Gastro-esophageal reflux disease with esophagitis, without bleeding: Secondary | ICD-10-CM | POA: Diagnosis not present

## 2021-08-17 ENCOUNTER — Other Ambulatory Visit: Payer: Self-pay | Admitting: Gastroenterology

## 2021-08-17 DIAGNOSIS — K760 Fatty (change of) liver, not elsewhere classified: Secondary | ICD-10-CM

## 2021-08-19 ENCOUNTER — Other Ambulatory Visit: Payer: Self-pay | Admitting: Gastroenterology

## 2021-08-19 DIAGNOSIS — K760 Fatty (change of) liver, not elsewhere classified: Secondary | ICD-10-CM

## 2021-08-20 ENCOUNTER — Ambulatory Visit: Payer: BLUE CROSS/BLUE SHIELD

## 2021-08-26 ENCOUNTER — Ambulatory Visit
Admission: RE | Admit: 2021-08-26 | Discharge: 2021-08-26 | Disposition: A | Discharge status: Home / Self Care | Payer: BC Managed Care – PPO | Source: Ambulatory Visit | Attending: Gastroenterology | Admitting: Gastroenterology

## 2021-08-26 DIAGNOSIS — K760 Fatty (change of) liver, not elsewhere classified: Secondary | ICD-10-CM

## 2021-08-26 DIAGNOSIS — K8020 Calculus of gallbladder without cholecystitis without obstruction: Secondary | ICD-10-CM | POA: Diagnosis not present

## 2021-08-26 DIAGNOSIS — K7689 Other specified diseases of liver: Secondary | ICD-10-CM | POA: Diagnosis not present

## 2022-03-30 NOTE — Progress Notes (Unsigned)
     I,Sha'taria Tyson,acting as a Education administrator for [employer], PA-C.,have documented all relevant documentation on the behalf of Mikey Kirschner, PA-C,as directed by  Mikey Kirschner, PA-C while in the presence of Mikey Kirschner, PA-C.   Established patient visit   Patient: Kevin Roach   DOB: [birth year]   42 y.o. Male  MRN: [id number] Visit Date: 03/31/2022  Today's healthcare provider: Mikey Kirschner, PA-C   No chief complaint on file.  Subjective    HPI  ***  Medications: Outpatient Medications Prior to Visit  Medication Sig   cyclobenzaprine (FLEXERIL) 5 MG tablet Take 1 tablet (5 mg total) by mouth 3 (three) times daily as needed for muscle spasms.   meloxicam (MOBIC) 15 MG tablet TAKE 1 TABLET BY MOUTH EVERY DAY   pantoprazole (PROTONIX) 40 MG tablet Take 40 mg by mouth daily.   No facility-administered medications prior to visit.    Review of Systems  {Labs  Heme  Chem  Endocrine  Serology  Results Review (optional):23779}   Objective    There were no vitals taken for this visit. {Show previous vital signs (optional):23777}  Physical Exam  ***  No results found for any visits on 03/31/22.  Assessment & Plan     ***  No follow-ups on file.      {provider attestation***:1}   Mikey Kirschner, PA-C  [place] [phone] (phone) [phone] (fax)  [place]

## 2022-03-31 ENCOUNTER — Ambulatory Visit (INDEPENDENT_AMBULATORY_CARE_PROVIDER_SITE_OTHER): Payer: BC Managed Care – PPO | Admitting: Physician Assistant

## 2022-03-31 ENCOUNTER — Other Ambulatory Visit: Payer: Self-pay | Admitting: Physician Assistant

## 2022-03-31 ENCOUNTER — Encounter: Payer: Self-pay | Admitting: Physician Assistant

## 2022-03-31 DIAGNOSIS — M5441 Lumbago with sciatica, right side: Secondary | ICD-10-CM

## 2022-03-31 MED ORDER — METHYLPREDNISOLONE 4 MG PO TBPK: ORAL_TABLET | ORAL | 0 refills | Status: AC

## 2022-03-31 MED ORDER — CELECOXIB 100 MG PO CAPS: 100.0000 mg | ORAL_CAPSULE | Freq: Two times a day (BID) | ORAL | 0 refills | Status: DC

## 2022-03-31 MED ORDER — CYCLOBENZAPRINE HCL 5 MG PO TABS: 5.0000 mg | ORAL_TABLET | Freq: Three times a day (TID) | ORAL | 1 refills | Status: AC | PRN

## 2022-03-31 NOTE — Telephone Encounter (Signed)
Requested medication (s) are due for refill today: yes  Requested medication (s) are on the active medication list: yes  Last refill:  03/31/22  Future visit scheduled: yes  Notes to clinic:  pharmacy comment: Alternative Requested:PA REQUIRED.      Requested Prescriptions  Pending Prescriptions Disp Refills   celecoxib (CELEBREX) 100 MG capsule [Pharmacy Med Name: CELECOXIB 100 MG CAPSULE] 60 capsule 0    Sig: TAKE 1 CAPSULE BY MOUTH TWICE A DAY     Analgesics:  COX2 Inhibitors Failed - 03/31/2022 10:13 AM      Failed - Manual Review: Labs are only required if the patient has taken medication for more than 8 weeks.      Failed - HGB in normal range and within 360 days    Hemoglobin  Date Value Ref Range Status  12/20/2016 16.1 13.0 - 18.0 g/dL Final         Failed - Cr in normal range and within 360 days    Creatinine, Ser  Date Value Ref Range Status  11/30/2014 1.20 0.61 - 1.24 mg/dL Final         Failed - HCT in normal range and within 360 days    HCT  Date Value Ref Range Status  12/20/2016 47.7 40.0 - 52.0 % Final         Failed - AST in normal range and within 360 days    AST  Date Value Ref Range Status  11/30/2014 51 (H) 15 - 41 U/L Final         Failed - ALT in normal range and within 360 days    ALT  Date Value Ref Range Status  11/30/2014 86 (H) 17 - 63 U/L Final         Failed - eGFR is 30 or above and within 360 days    GFR calc Af Amer  Date Value Ref Range Status  11/30/2014 >60 >60 mL/min Final    Comment:    (NOTE) The eGFR has been calculated using the CKD EPI equation. This calculation has not been validated in all clinical situations. eGFR's persistently <60 mL/min signify possible Chronic Kidney Disease.    GFR calc non Af Amer  Date Value Ref Range Status  11/30/2014 >60 >60 mL/min Final         Passed - Patient is not pregnant      Passed - Valid encounter within last 12 months    Recent Outpatient Visits           Today  Acute bilateral low back pain with right-sided sciatica   [facility] Mikey Kirschner, PA-C   1 year ago Encounter for physical examination   [facility] Mikey Kirschner, PA-C   3 years ago Acute bilateral low back pain without sciatica   [facility] Carles Collet M, [place]   3 years ago Fever, unspecified fever cause   [place], [place], [place]   6 years ago Chronic right-sided low back pain without sciatica   [place], [place]

## 2022-05-04 ENCOUNTER — Other Ambulatory Visit: Payer: Self-pay | Admitting: Physician Assistant

## 2022-05-04 DIAGNOSIS — M5441 Lumbago with sciatica, right side: Secondary | ICD-10-CM

## 2023-03-20 ENCOUNTER — Telehealth: Payer: Self-pay | Admitting: Family Medicine

## 2023-03-20 ENCOUNTER — Other Ambulatory Visit (HOSPITAL_COMMUNITY): Payer: Self-pay

## 2023-03-20 NOTE — Telephone Encounter (Signed)
 Covermymeds is requesting prior authorization Key: [id number] CeleBREX 100MG  capsules

## 2023-03-21 NOTE — Telephone Encounter (Signed)
 Lm advising patient to either upload or drop off updated insurance information. Call back with any questions or concerns.

## 2023-03-22 ENCOUNTER — Other Ambulatory Visit (HOSPITAL_COMMUNITY): Payer: Self-pay

## 2023-03-31 ENCOUNTER — Other Ambulatory Visit (HOSPITAL_COMMUNITY): Payer: Self-pay
